# Patient Record
Sex: Female | Born: 1980 | Hispanic: Yes | Marital: Married | Smoking: Never smoker
Health system: Southern US, Community
[De-identification: ages and names within clinical notes are randomized; demographics above are authoritative.]

## PROBLEM LIST (undated history)

## (undated) DIAGNOSIS — A6000 Herpesviral infection of urogenital system, unspecified: Secondary | ICD-10-CM

## (undated) DIAGNOSIS — O139 Gestational [pregnancy-induced] hypertension without significant proteinuria, unspecified trimester: Secondary | ICD-10-CM

## (undated) DIAGNOSIS — O24419 Gestational diabetes mellitus in pregnancy, unspecified control: Secondary | ICD-10-CM

## (undated) DIAGNOSIS — N390 Urinary tract infection, site not specified: Secondary | ICD-10-CM

## (undated) DIAGNOSIS — O352XX0 Maternal care for (suspected) hereditary disease in fetus, not applicable or unspecified: Secondary | ICD-10-CM

## (undated) HISTORY — DX: Urinary tract infection, site not specified: N39.0

## (undated) HISTORY — DX: Herpesviral infection of urogenital system, unspecified: A60.00

## (undated) HISTORY — DX: Gestational diabetes mellitus in pregnancy, unspecified control: O24.419

## (undated) HISTORY — DX: Gestational (pregnancy-induced) hypertension without significant proteinuria, unspecified trimester: O13.9

## (undated) HISTORY — PX: NO PAST SURGERIES: SHX2092

## (undated) HISTORY — DX: Maternal care for (suspected) hereditary disease in fetus, not applicable or unspecified: O35.2XX0

---
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
## 2009-08-29 ENCOUNTER — Other Ambulatory Visit: Payer: Self-pay

## 2014-07-05 LAB — CYTOLOGY - PAP: Pap: NEGATIVE

## 2016-01-26 NOTE — L&D Delivery Note (Signed)
Delivery Note At 10:36 AM a viable female was delivered via Vaginal, Spontaneous Delivery (Presentation: OA; no difficulty with shoulders).  APGAR: 9, 9; weight pending.   Placenta status: spontaneous and grossly intact.  Cord: three vessel  with the following complications: none Anesthesia: epidural Episiotomy: None Lacerations: bilateral 1st degree labial tears, very superficial posterior 1st degree labial tear.  Hemostatic and will approximate without suturing Est. Blood Loss (mL): 100  Mom to postpartum.  Baby to Couplet care / Skin to Skin.  Kathrene Alu 09/14/2016, 10:56 AM  I confirm that I have verified the information documented in the resident's note and that I have also personally reperformed the physical exam and all medical decision making activities.    I was gloved and present for entire delivery SVD without incident No difficulty with shoulders  Lacerations as listed above These both approximate well and are not bleeding, so decision made not to repair.  Seabron Spates, CNM

## 2016-02-23 ENCOUNTER — Other Ambulatory Visit (HOSPITAL_COMMUNITY): Payer: Self-pay | Admitting: Nurse Practitioner

## 2016-02-23 DIAGNOSIS — Z369 Encounter for antenatal screening, unspecified: Secondary | ICD-10-CM

## 2016-02-23 LAB — HEMOGLOBIN EVAL RFX ELECTROPHORESIS: Hemoglobin Evaluation: NORMAL

## 2016-02-23 LAB — OB RESULTS CONSOLE VARICELLA ZOSTER ANTIBODY, IGG: Varicella: IMMUNE

## 2016-02-23 LAB — OB RESULTS CONSOLE ABO/RH: RH Type: POSITIVE

## 2016-02-23 LAB — OB RESULTS CONSOLE RPR: RPR: NONREACTIVE

## 2016-02-23 LAB — OB RESULTS CONSOLE HGB/HCT, BLOOD
HEMATOCRIT: 41 %
HEMOGLOBIN: 12.4 g/dL

## 2016-02-23 LAB — OB RESULTS CONSOLE GC/CHLAMYDIA
Chlamydia: NEGATIVE
Gonorrhea: NEGATIVE

## 2016-02-23 LAB — OB RESULTS CONSOLE PLATELET COUNT: PLATELETS: 245 10*3/uL

## 2016-02-23 LAB — OB RESULTS CONSOLE HEPATITIS B SURFACE ANTIGEN: Hepatitis B Surface Ag: NEGATIVE

## 2016-02-23 LAB — URINE CULTURE
Glucose 1 Hr Prenatal, POC: 197 mg/dL
URINE CULTURE, OB: NEGATIVE

## 2016-02-23 LAB — OB RESULTS CONSOLE ANTIBODY SCREEN: ANTIBODY SCREEN: NEGATIVE

## 2016-02-23 LAB — OB RESULTS CONSOLE HIV ANTIBODY (ROUTINE TESTING): HIV: NONREACTIVE

## 2016-02-27 ENCOUNTER — Encounter: Payer: Self-pay | Admitting: *Deleted

## 2016-03-01 ENCOUNTER — Ambulatory Visit: Payer: Self-pay | Admitting: *Deleted

## 2016-03-01 ENCOUNTER — Other Ambulatory Visit: Payer: Self-pay

## 2016-03-01 ENCOUNTER — Encounter: Payer: Medicaid Other | Attending: Obstetrics & Gynecology | Admitting: *Deleted

## 2016-03-01 DIAGNOSIS — Z713 Dietary counseling and surveillance: Secondary | ICD-10-CM | POA: Diagnosis present

## 2016-03-01 DIAGNOSIS — O24410 Gestational diabetes mellitus in pregnancy, diet controlled: Secondary | ICD-10-CM

## 2016-03-01 DIAGNOSIS — O24419 Gestational diabetes mellitus in pregnancy, unspecified control: Secondary | ICD-10-CM | POA: Diagnosis not present

## 2016-03-01 MED ORDER — GLUCOSE BLOOD VI STRP: ORAL_STRIP | 12 refills | Status: DC

## 2016-03-01 MED ORDER — ACCU-CHEK FASTCLIX LANCETS MISC: 1.0000 | Freq: Four times a day (QID) | 12 refills | Status: DC

## 2016-03-01 MED ORDER — ACCU-CHEK GUIDE W/DEVICE KIT: 1.0000 | PACK | Freq: Once | 0 refills | Status: AC

## 2016-03-01 NOTE — Progress Notes (Signed)
  Patient was seen on 03/01/2016 for Gestational Diabetes self-management . She is at 11 weeks today. She [facility] and was told she did not pass, so she is receiving education on GDM. The following learning objectives were met by the patient :   States the definition of Gestational Diabetes  States why dietary management is important in controlling blood glucose, she states this was covered by her visit with the [facility] RD  Describes the effects of carbohydrates on blood glucose levels  Demonstrates ability to create a balanced meal plan  Demonstrates carbohydrate counting   States when to check blood glucose levels  Demonstrates proper blood glucose monitoring techniques  States the effect of stress and exercise on blood glucose levels  States the importance of limiting caffeine and abstaining from alcohol and smoking  Plan:  Aim for 3 Carb Choices per meal (45 grams) +/- 1 either way  Aim for 1-2 Carbs per snack Begin reading food labels for Total Carbohydrate of foods Consider  increasing your activity level by walking or other activity daily as tolerated Begin checking BG before breakfast and 2 hours after first bite of breakfast, lunch and dinner as directed by MD  Take medication if directed by MD  Blood glucose monitor Rx called into pharmacy @ [facility] Patient instructed to test pre breakfast and 2 hours each meal as directed by MD Bring Log Book to every medical appointment   Patient instructed to monitor glucose levels: FBS: 60 - <90 2 hour: <120  Patient received the following handouts:  BG Log Sheets in case Log Book does not come with meter  Patient will be seen for follow-up as needed.

## 2016-03-03 DIAGNOSIS — O0990 Supervision of high risk pregnancy, unspecified, unspecified trimester: Secondary | ICD-10-CM | POA: Insufficient documentation

## 2016-03-03 NOTE — Progress Notes (Signed)
Tranferring care from health department. Declined flu vaccine at health department.

## 2016-03-08 ENCOUNTER — Encounter: Payer: Self-pay | Admitting: Obstetrics & Gynecology

## 2016-03-10 ENCOUNTER — Other Ambulatory Visit (HOSPITAL_COMMUNITY): Payer: Self-pay | Admitting: Nurse Practitioner

## 2016-03-10 ENCOUNTER — Ambulatory Visit (HOSPITAL_COMMUNITY): Payer: Medicaid Other

## 2016-03-10 ENCOUNTER — Other Ambulatory Visit (HOSPITAL_COMMUNITY): Payer: Self-pay | Admitting: *Deleted

## 2016-03-10 ENCOUNTER — Ambulatory Visit (HOSPITAL_COMMUNITY)
Admission: RE | Admit: 2016-03-10 | Discharge: 2016-03-10 | Disposition: A | Discharge status: Home / Self Care | Payer: Medicaid Other | Source: Ambulatory Visit | Attending: Nurse Practitioner | Admitting: Nurse Practitioner

## 2016-03-10 ENCOUNTER — Encounter (HOSPITAL_COMMUNITY): Payer: Self-pay

## 2016-03-10 ENCOUNTER — Other Ambulatory Visit: Payer: Self-pay

## 2016-03-10 ENCOUNTER — Encounter: Payer: Self-pay | Admitting: *Deleted

## 2016-03-10 DIAGNOSIS — Z369 Encounter for antenatal screening, unspecified: Secondary | ICD-10-CM

## 2016-03-10 DIAGNOSIS — Z3682 Encounter for antenatal screening for nuchal translucency: Secondary | ICD-10-CM

## 2016-03-10 DIAGNOSIS — Z3A12 12 weeks gestation of pregnancy: Secondary | ICD-10-CM

## 2016-03-10 DIAGNOSIS — O09299 Supervision of pregnancy with other poor reproductive or obstetric history, unspecified trimester: Secondary | ICD-10-CM

## 2016-03-10 DIAGNOSIS — O24311 Unspecified pre-existing diabetes mellitus in pregnancy, first trimester: Secondary | ICD-10-CM

## 2016-03-10 DIAGNOSIS — Z315 Encounter for genetic counseling: Secondary | ICD-10-CM | POA: Diagnosis present

## 2016-03-10 DIAGNOSIS — O24911 Unspecified diabetes mellitus in pregnancy, first trimester: Secondary | ICD-10-CM | POA: Diagnosis not present

## 2016-03-10 DIAGNOSIS — O352XX0 Maternal care for (suspected) hereditary disease in fetus, not applicable or unspecified: Secondary | ICD-10-CM | POA: Insufficient documentation

## 2016-03-10 DIAGNOSIS — O09291 Supervision of pregnancy with other poor reproductive or obstetric history, first trimester: Secondary | ICD-10-CM | POA: Insufficient documentation

## 2016-03-10 DIAGNOSIS — O24410 Gestational diabetes mellitus in pregnancy, diet controlled: Secondary | ICD-10-CM

## 2016-03-10 LAB — ROUTINE CHROMOSOME - KARYOTYPE

## 2016-03-10 NOTE — Progress Notes (Signed)
Appointment Date: 03/10/2016 DOB: 1980-08-26 Referring Provider: Nicholaus Bloom, NP Attending: Dr. Griffin Dakin  Ms. Jalexa Greninger and her partner, Gladstone Pih, were seen for genetic counseling because of a maternal age of 36 y.o. and a son with a chromosome deletion.  In summary:  Discussed AMA and associated risk for fetal aneuploidy  Will not be quite 36 at the time of delivery  Discussed options for screening  First screen  NIPS - Panorama drawn today  Discussed diagnostic testing options  Amniocentesis - declined  Reviewed family history concerns  Son with seizure disorder with chromosome microdeletion  Offered parental chromosome analysis with microarray  Patient's blood drawn today  Discussed carrier screening options - declined  CF  SMA  Hemoglobinopathies  We reviewed chromosomes, nondisjunction, and the associated 1 in 130 risk for fetal aneuploidy related to a maternal age of 36 y.o. at [redacted]w[redacted]d gestation.  We specifically discussed Down syndrome (trisomy 76), trisomies 38 and 20, and sex chromosome aneuploidies (47,XXX and 47,XXY) including the common features and prognoses of each.   We reviewed available screening options including First Screen, Quad screen, noninvasive prenatal screening (NIPS)/cell free DNA (cfDNA) screening, and detailed ultrasound. They were counseled that screening tests are used to modify a patient's a priori risk for aneuploidy, typically based on age. This estimate provides a pregnancy specific risk assessment. We reviewed the benefits and limitations of each option. Specifically, we discussed the conditions for which each test screens, the detection rates, and false positive rates of each. We discussed the possible results that the tests might provide including: positive, negative, unanticipated, and no result. Finally, they were counseled regarding the cost of each option and potential out of pocket expenses.   After consideration of all  the options, they elected to proceed with NIPS.  Those results will be available in 8-10 days.    A nuchal translucency ultrasound was performed today.  The report will be documented separately.    Ms. Leath was provided with written information regarding cystic fibrosis (CF), spinal muscular atrophy (SMA) and hemoglobinopathies including the carrier frequency, availability of carrier screening and prenatal diagnosis if indicated.  In addition, we discussed that CF and hemoglobinopathies are routinely screened for as part of the [facility] newborn screening panel.  After further discussion, she declined screening for CF, SMA and hemoglobinopathies.  Both family histories were reviewed.  Ms. Hoey reported that she has a 20 year old son Wende Neighbors dob 08/26/99) from another relationship who began to have seizures when he was 57 months old.  Her son underwent chromosome analysis with microarray at [facility] in 2011.  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  This deletion contains at least two genes including TTC21B and SCN1A.  Mutations of SCN1A have been associated with: autosomal dominant Dravet syndrome; autosomal dominant generalized epilepsy with febrile seizures, type 2; familial febrile seizures, 3A; and autosomal dominant familial hemiplegic migraine.  Ms. Sylla describes her son as having intellectual disability as well autism.  He has impaired mobility, few words, is incontinent, and requires help with daily living skills.  She gave consent for the genetic counselor to review her son's medical records at [facility].  Based upon her son's medical history, it seems possible that his symptoms are due to the presence of this chromosomal microdeletion.  We discussed that many microdeletions, such as the one identified in  her son, are de novo or new in the family, not inherited.  If this was to be a new genetic  change for her son, there would not be expected to be an increased chance for a similar deletion in another child.  It is unlikely that Ms. Rountree or her son's father has the same deletion as they do not have the same symptoms as those of their son.  In rare cases a familial balanced translocation can be responsible for a chromosome microdeletion.  For this reason, Ms. Quero was offered the option of peripheral blood chromosome analysis and microarray analysis.  She wanted to have this drawn today.  Those results will be provided to her when they are complete.  The remainder of the family histories were reviewed and found to be noncontributory for birth defects, intellectual disability, and known genetic conditions. Without further information regarding the provided family history, an accurate genetic risk cannot be calculated. Further genetic counseling is warranted if more information is obtained.  Ms. Weatherley denied exposure to environmental toxins or chemical agents. She denied the use of alcohol, tobacco or street drugs. She denied significant viral illnesses during the course of her pregnancy. Her medical and surgical histories were noncontributory.   I counseled this couple regarding the above risks and available options.  The approximate face-to-face time with the genetic counselor was 50 minutes.  Most of the counseling was provided by Arcola Jansky, [employer] genetic counseling student, under my direct supervision.   Cam Hai, MS,  Certified Genetic Counselor

## 2016-03-11 LAB — HEMOGLOBIN A1C
Hgb A1c MFr Bld: 5.7 % — ABNORMAL HIGH (ref 4.8–5.6)
Mean Plasma Glucose: 117 mg/dL

## 2016-03-15 ENCOUNTER — Encounter: Payer: Self-pay | Admitting: Obstetrics and Gynecology

## 2016-03-17 ENCOUNTER — Telehealth (HOSPITAL_COMMUNITY): Payer: Self-pay | Admitting: Genetics

## 2016-03-17 ENCOUNTER — Telehealth: Payer: Self-pay | Admitting: *Deleted

## 2016-03-17 DIAGNOSIS — O24410 Gestational diabetes mellitus in pregnancy, diet controlled: Secondary | ICD-10-CM

## 2016-03-17 MED ORDER — GLUCOSE BLOOD VI STRP: ORAL_STRIP | 12 refills | Status: DC

## 2016-03-17 NOTE — Telephone Encounter (Signed)
[place] to discuss her prenatal cell free DNA test results.  Mrs. Elea Krafft had Panorama screening through [facility] at [facility].  Screening was offered because of AMA.   The patient was identified by name and DOB.  We reviewed that these are within normal limits, showing a less than 1 in 10,000 risk for trisomies 21, 18 and 13, and monosomy X (Turner syndrome).  In addition, the risk for triploidy and sex chromosome trisomies (47,XXX and 47,XXY) was also low risk.  We reviewed that this testing identifies > 99% of pregnancies with trisomy 13, trisomy 35, sex chromosome trisomies (47,XXX and 47,XXY), and triploidy. The detection rate for trisomy 18 is 96%.  The detection rate for monosomy X is ~92%.  The false positive rate is <0.1% for all conditions. Testing was also consistent with female fetal sex.  The patient did wish to know fetal sex.    Maternal peripheral blood chromosomes and array are still pending.  We will call her again when those are avaialble.  She understands that this testing does not identify all genetic conditions.  All questions were answered to her satisfaction, she was encouraged to call with additional questions or concerns.  Cam Hai, MS Certified Genetic Counselor

## 2016-03-17 NOTE — Telephone Encounter (Signed)
Pt left message stating that she is running out of test strips. She states her pharmacy has contacted the doctor but no response. Rx updated and e-prescribed to pharmacy. Pt was notified and voiced understanding.

## 2016-03-18 ENCOUNTER — Other Ambulatory Visit: Payer: Self-pay

## 2016-03-22 ENCOUNTER — Other Ambulatory Visit: Payer: Self-pay

## 2016-03-23 ENCOUNTER — Encounter: Payer: Self-pay | Admitting: Obstetrics and Gynecology

## 2016-03-23 ENCOUNTER — Ambulatory Visit (INDEPENDENT_AMBULATORY_CARE_PROVIDER_SITE_OTHER): Payer: Medicaid Other | Admitting: Obstetrics and Gynecology

## 2016-03-23 VITALS — BP 104/64 | HR 75 | Wt 172.1 lb

## 2016-03-23 DIAGNOSIS — O24410 Gestational diabetes mellitus in pregnancy, diet controlled: Secondary | ICD-10-CM | POA: Diagnosis present

## 2016-03-23 DIAGNOSIS — O352XX0 Maternal care for (suspected) hereditary disease in fetus, not applicable or unspecified: Secondary | ICD-10-CM

## 2016-03-23 DIAGNOSIS — B009 Herpesviral infection, unspecified: Secondary | ICD-10-CM

## 2016-03-23 DIAGNOSIS — Z6831 Body mass index (BMI) 31.0-31.9, adult: Secondary | ICD-10-CM

## 2016-03-23 DIAGNOSIS — O99210 Obesity complicating pregnancy, unspecified trimester: Secondary | ICD-10-CM | POA: Insufficient documentation

## 2016-03-23 DIAGNOSIS — O24419 Gestational diabetes mellitus in pregnancy, unspecified control: Secondary | ICD-10-CM

## 2016-03-23 DIAGNOSIS — O99212 Obesity complicating pregnancy, second trimester: Secondary | ICD-10-CM | POA: Diagnosis not present

## 2016-03-23 DIAGNOSIS — O0992 Supervision of high risk pregnancy, unspecified, second trimester: Secondary | ICD-10-CM

## 2016-03-23 HISTORY — DX: Herpesviral infection, unspecified: B00.9

## 2016-03-23 MED ORDER — ASPIRIN EC 81 MG PO TBEC: 81.0000 mg | DELAYED_RELEASE_TABLET | Freq: Every day | ORAL | 6 refills | Status: DC

## 2016-03-23 MED ORDER — GLYBURIDE 2.5 MG PO TABS: 1.2500 mg | ORAL_TABLET | Freq: Every day | ORAL | 6 refills | Status: DC

## 2016-03-23 NOTE — Progress Notes (Signed)
New OB Note  03/23/2016   Clinic: [facility] [place]  Chief Complaint: NOB  Transfer of Care Patient: Yes, [facility]  History of Present Illness: Ms. Treichel is a 36 y.o. G2P1001 @ 14/1 weeks ([place] 8/27, based on Patient's last menstrual period was 12/15/2015.=12wk u/s).  Preg complicated by has Gestational diabetes mellitus; Supervision of high-risk pregnancy; Hereditary disease in family possibly affecting fetus, affecting management of mother, antepartum condition or complication, not applicable or unspecified fetus; History of HSV-1 infection; BMI 31.0-31.9,adult; and Obesity in pregnancy on her problem list.   No s/s of PTL  ROS: A 12-point review of systems was performed and negative, except as stated in the above HPI.  OBGYN History: As per HPI. OB History  Gravida Para Term Preterm AB Living  2 1 1     1   SAB TAB Ectopic Multiple Live Births               # Outcome Date GA Lbr Len/2nd Weight Sex Delivery Anes PTL Lv  2 Current           1 Term 08/26/02             Birth Comments: Baby diagnosed with chromosomal deletion r/t autism, epilepsy    Obstetric Comments  8lbs 11oz.     Any issues with any prior pregnancies: no Prior children are healthy, doing well, and without any problems or issues: no, son with chromosomal deletion, MR and seizures.  Pap history: 2015 NILM but no EC/TZ   Past Medical History: Past Medical History:  Diagnosis Date  . UTI (urinary tract infection)    2015    Past Surgical History: Past Surgical History:  Procedure Laterality Date  . NO PAST SURGERIES      Family History:  Family History  Problem Relation Age of Onset  . Diabetes Mother   . Diabetes Father     Social History:  Social History   Social History  . Marital status: Divorced    Spouse name: N/A  . Number of children: N/A  . Years of education: N/A   Occupational History  . Not on file.   Social History Main Topics  . Smoking status: Never Smoker  .  Smokeless tobacco: Never Used  . Alcohol use No  . Drug use: No  . Sexual activity: Yes   Other Topics Concern  . Not on file   Social History Narrative  . No narrative on file    Allergy: No Known Allergies  Health Maintenance:  Mammogram Up to Date: not applicable  Current Outpatient Medications: PNV  Physical Exam:   BP 104/64   Pulse 75   Wt 172 lb 1.6 oz (78.1 kg)   LMP 12/15/2015   BMI 30.49 kg/m  Body mass index is 30.49 kg/m. Fundal height: not applicable FHTs: 99991111  General appearance: Well nourished, well developed female in no acute distress.   Laboratory: [facility] labs 1/29 Negative GC/CT/UCx/AB pos/CBC/RPR/HIV/UCx/Vi  Imaging:  NT scan normal  Assessment: pt doing well  Plan: 1. Diet controlled gestational diabetes mellitus (GDM) in first trimester Seen by DM already. Log book shows elevated fastings in the low 100s but normal 2hr PPs. Patient watching her diet closely already. Recommend starting low dose glyburide qhs which she is amenable to. (1.25mg ). a1c 5.7 earlier this month. Will get baseline pre-x labs. Pt also amenable to starting baby asa. Recommend fetal echo at 22-23wks.  - Protein / creatinine ratio, urine - Comprehensive metabolic  panel  2. Supervision of high risk pregnancy in second trimester Routine care. Offer afp nv. Follow up pending HD labs.   3. Obesity in pregnancy No change in plan of care  4. History of HSV-1 infection Offer valtrex ppx later in pregnancy  5. Hereditary disease in family possibly affecting fetus, affecting management of mother, antepartum condition or complication, not applicable or unspecified fetus Seen by [facility] already and negative microarray and panorama. F/u anatomy scan  6. BMI 31.0-31.9,adult See above.   Problem list reviewed and updated.  Follow up in 2 weeks.  >50% of 20 min visit spent on counseling and coordination of care.     Durene Romans MD Attending [facility] for [facility])

## 2016-03-24 ENCOUNTER — Telehealth (HOSPITAL_COMMUNITY): Payer: Self-pay | Admitting: Genetics

## 2016-03-24 LAB — COMPREHENSIVE METABOLIC PANEL
ALBUMIN: 3.9 g/dL (ref 3.5–5.5)
ALT: 11 IU/L (ref 0–32)
AST: 11 IU/L (ref 0–40)
Albumin/Globulin Ratio: 1.3 (ref 1.2–2.2)
Alkaline Phosphatase: 48 IU/L (ref 39–117)
BUN / CREAT RATIO: 26 — AB (ref 9–23)
BUN: 10 mg/dL (ref 6–20)
Bilirubin Total: 0.2 mg/dL (ref 0.0–1.2)
CALCIUM: 9.5 mg/dL (ref 8.7–10.2)
CO2: 23 mmol/L (ref 18–29)
Chloride: 101 mmol/L (ref 96–106)
Creatinine, Ser: 0.38 mg/dL — ABNORMAL LOW (ref 0.57–1.00)
GFR calc non Af Amer: 139 mL/min/{1.73_m2} (ref >59)
GFR, EST AFRICAN AMERICAN: 160 mL/min/{1.73_m2} (ref >59)
GLUCOSE: 95 mg/dL (ref 65–99)
Globulin, Total: 3 g/dL (ref 1.5–4.5)
Potassium: 4 mmol/L (ref 3.5–5.2)
Sodium: 139 mmol/L (ref 134–144)
TOTAL PROTEIN: 6.9 g/dL (ref 6.0–8.5)

## 2016-03-24 LAB — PROTEIN / CREATININE RATIO, URINE
Creatinine, Urine: 65.3 mg/dL
Protein, Ur: 5.4 mg/dL
Protein/Creat Ratio: 83 mg/g creat (ref 0–200)

## 2016-03-24 NOTE — Telephone Encounter (Signed)
Called Ms. Catherine Houston to review her normal microarray results.  Discussed that this means that the deletion that her older son has either came from his father or, more likely, as a spontaneous change for her son.  With her chromosomes being normal , she would not have an increased likelihood for having offspring with microdeletions, and as she does not have the same deletion, she would not be at increased risk to pass the deletion on.  As the father of her son has not been tested, we can not determine if her son's microdeletion came from him or not; however as the father of her current pregnancy is different, it would not pose an increased risk to her current pregnancy.  She asked about the amniocentesis.  We discussed that an amniocentesis would identify microdeletions and duplications, but based on the information currently available, we did not have reason to believe that there was an increased chance for a deletion or duplication in her current pregnancy.  Discussed that the risk of complications from amniocentesis was likely greater than the chance that there would be a change identified.  However, if she felt more reassured by having an amniocentesis, that is certainly an option.  She declined amniocentesis at this time.  All questions were answered to her satisfaction and she has my contact information should additional questions or concerns arise.

## 2016-04-06 ENCOUNTER — Ambulatory Visit (INDEPENDENT_AMBULATORY_CARE_PROVIDER_SITE_OTHER): Payer: Medicaid Other | Admitting: Obstetrics and Gynecology

## 2016-04-06 VITALS — BP 116/63 | HR 79 | Wt 175.2 lb

## 2016-04-06 DIAGNOSIS — O352XX0 Maternal care for (suspected) hereditary disease in fetus, not applicable or unspecified: Secondary | ICD-10-CM

## 2016-04-06 DIAGNOSIS — O0992 Supervision of high risk pregnancy, unspecified, second trimester: Secondary | ICD-10-CM

## 2016-04-06 DIAGNOSIS — O24419 Gestational diabetes mellitus in pregnancy, unspecified control: Secondary | ICD-10-CM | POA: Diagnosis present

## 2016-04-06 DIAGNOSIS — B009 Herpesviral infection, unspecified: Secondary | ICD-10-CM

## 2016-04-06 LAB — POCT URINALYSIS DIP (DEVICE)
Bilirubin Urine: NEGATIVE
Glucose, UA: NEGATIVE mg/dL
Ketones, ur: 15 mg/dL — AB
Leukocytes, UA: NEGATIVE
NITRITE: NEGATIVE
PH: 7 (ref 5.0–8.0)
Protein, ur: NEGATIVE mg/dL
Specific Gravity, Urine: 1.02 (ref 1.005–1.030)
UROBILINOGEN UA: 0.2 mg/dL (ref 0.0–1.0)

## 2016-04-06 NOTE — Progress Notes (Signed)
Subjective:  Catherine Houston is a 36 y.o. G2P1001 at [redacted]w[redacted]d being seen today for ongoing prenatal care.  She is currently monitored for the following issues for this high-risk pregnancy and has GDM, class A2; Supervision of high-risk pregnancy; Hereditary disease in family possibly affecting fetus, affecting management of mother, antepartum condition or complication, not applicable or unspecified fetus; History of HSV-1 infection; BMI 31.0-31.9,adult; and Obesity in pregnancy on her problem list.  Patient reports no complaints.   . Vag. Bleeding: None.  Movement: Present. Denies leaking of fluid.   The following portions of the patient's history were reviewed and updated as appropriate: allergies, current medications, past family history, past medical history, past social history, past surgical history and problem list. Problem list updated.  Objective:   Vitals:   04/06/16 1247  BP: 116/63  Pulse: 79  Weight: 175 lb 3.2 oz (79.5 kg)    Fetal Status: Fetal Heart Rate (bpm): 150   Movement: Present     General:  Alert, oriented and cooperative. Patient is in no acute distress.  Skin: Skin is warm and dry. No rash noted.   Cardiovascular: Normal heart rate noted  Respiratory: Normal respiratory effort, no problems with respiration noted  Abdomen: Soft, gravid, appropriate for gestational age. Pain/Pressure: Absent     Pelvic:  Cervical exam deferred        Extremities: Normal range of motion.  Edema: None  Mental Status: Normal mood and affect. Normal behavior. Normal judgment and thought content.   Urinalysis:      Assessment and Plan:  Pregnancy: G2P1001 at [redacted]w[redacted]d  1. GDM, class A2 BS are in goal range for the most part. Outliers appear to be diet related. Reviewed diet with pt. Will continue with Glyburide at present dose. Pt instructed to call nurse with BS report in 2 weeks - US Fetal Echocardiography; Future  2. Supervision of high risk pregnancy in second trimester Continue with  BASA and PNV  3. Hereditary disease in family possibly affecting fetus, affecting management of mother, antepartum condition or complication, not applicable or unspecified fetus Normal genetic screening  AFP today  4. History of HSV-1 infection Prophylaxis starting at 36 weeks  Preterm labor symptoms and general obstetric precautions including but not limited to vaginal bleeding, contractions, leaking of fluid and fetal movement were reviewed in detail with the patient. Please refer to After Visit Summary for other counseling recommendations.  Return in about 4 weeks (around 05/04/2016) for OB visit.   Chancy Milroy, MD

## 2016-04-06 NOTE — Patient Instructions (Signed)

## 2016-04-16 LAB — AFP, SERUM, OPEN SPINA BIFIDA
AFP MoM: 0.64
AFP Value: 19.4 ng/mL
Gest. Age on Collection Date: 16.1 weeks
Maternal Age At EDD: 34.9 years
OSBR RISK 1 IN: 10000
Test Results:: NEGATIVE
WEIGHT: 175 [lb_av]

## 2016-04-27 ENCOUNTER — Ambulatory Visit (HOSPITAL_COMMUNITY)
Admission: RE | Admit: 2016-04-27 | Discharge: 2016-04-27 | Disposition: A | Discharge status: Home / Self Care | Payer: Medicaid Other | Source: Ambulatory Visit | Attending: Nurse Practitioner | Admitting: Nurse Practitioner

## 2016-05-04 ENCOUNTER — Ambulatory Visit (INDEPENDENT_AMBULATORY_CARE_PROVIDER_SITE_OTHER): Payer: Medicaid Other | Admitting: Obstetrics and Gynecology

## 2016-05-04 VITALS — BP 104/66 | HR 75 | Wt 176.7 lb

## 2016-05-04 DIAGNOSIS — O24419 Gestational diabetes mellitus in pregnancy, unspecified control: Secondary | ICD-10-CM | POA: Diagnosis present

## 2016-05-04 DIAGNOSIS — O99212 Obesity complicating pregnancy, second trimester: Secondary | ICD-10-CM

## 2016-05-04 DIAGNOSIS — O0992 Supervision of high risk pregnancy, unspecified, second trimester: Secondary | ICD-10-CM | POA: Diagnosis not present

## 2016-05-04 DIAGNOSIS — O99210 Obesity complicating pregnancy, unspecified trimester: Secondary | ICD-10-CM

## 2016-05-04 MED ORDER — GLYBURIDE 2.5 MG PO TABS: 1.2500 mg | ORAL_TABLET | Freq: Two times a day (BID) | ORAL | 6 refills | Status: DC

## 2016-05-04 NOTE — Progress Notes (Signed)
Prenatal Visit Note Date: 05/04/2016 [facility]: [facility]  Subjective:  Catherine Houston is a 36 y.o. G2P1001 at [redacted]w[redacted]d being seen today for ongoing prenatal care.  She is currently monitored for the following issues for this high-risk pregnancy and has GDM, class A2; Supervision of high-risk pregnancy; Hereditary disease in family possibly affecting fetus, affecting management of mother, antepartum condition or complication, not applicable or unspecified fetus; History of HSV-1 infection; BMI 31.0-31.9,adult; and Obesity in pregnancy on her problem list.  Patient reports no complaints.   Contractions: Not present. Vag. Bleeding: None.  Movement: Present. Denies leaking of fluid.   The following portions of the patient's history were reviewed and updated as appropriate: allergies, current medications, past family history, past medical history, past social history, past surgical history and problem list. Problem list updated.  Objective:   Vitals:   05/04/16 1529  BP: 104/66  Pulse: 75  Weight: 176 lb 11.2 oz (80.2 kg)    Fetal Status: Fetal Heart Rate (bpm): 152   Movement: Present     General:  Alert, oriented and cooperative. Patient is in no acute distress.  Skin: Skin is warm and dry. No rash noted.   Cardiovascular: Normal heart rate noted  Respiratory: Normal respiratory effort, no problems with respiration noted  Abdomen: Soft, gravid, appropriate for gestational age. Pain/Pressure: Absent     Pelvic:  Cervical exam deferred        Extremities: Normal range of motion.  Edema: None  Mental Status: Normal mood and affect. Normal behavior. Normal judgment and thought content.   Urinalysis:      Assessment and Plan:  Pregnancy: G2P1001 at [redacted]w[redacted]d  1. GDM, class A2 Patient currently on glyburide 1.25 qhs. AM fastings in the low to mid 90s. 2hr PP breakfast normal. 2hr PP lunch and dinner in the 120s-140s close to about 25% of the time. Recommend doing glyburide 1.25  with breakfast and with dinner. Pt to come in on 4/19 for RN only visit before her anatomy and growth scan to review log book; pt told pretty good chance will have to increase dose then. Fetal echo normal yesterday  2. Obesity in pregnancy See above  3. Supervision of high risk pregnancy in second trimester No change in plan of care  Preterm labor symptoms and general obstetric precautions including but not limited to vaginal bleeding, contractions, leaking of fluid and fetal movement were reviewed in detail with the patient. Please refer to After Visit Summary for other counseling recommendations.  Return in about 9 days (around 05/13/2016) for RN only visit at 245 or 3pm for BS log check. 3-4wk ROB.   Aletha Halim, MD

## 2016-05-04 NOTE — Progress Notes (Signed)
Refused flu vaccine.

## 2016-05-11 ENCOUNTER — Ambulatory Visit (HOSPITAL_COMMUNITY): Payer: Medicaid Other

## 2016-05-13 ENCOUNTER — Encounter (HOSPITAL_COMMUNITY): Payer: Self-pay

## 2016-05-13 ENCOUNTER — Ambulatory Visit: Payer: Medicaid Other

## 2016-05-13 ENCOUNTER — Ambulatory Visit (HOSPITAL_COMMUNITY)
Admission: RE | Admit: 2016-05-13 | Discharge: 2016-05-13 | Disposition: A | Discharge status: Home / Self Care | Payer: Medicaid Other | Source: Ambulatory Visit | Attending: Nurse Practitioner | Admitting: Nurse Practitioner

## 2016-05-13 ENCOUNTER — Other Ambulatory Visit (HOSPITAL_COMMUNITY): Payer: Self-pay | Admitting: Obstetrics and Gynecology

## 2016-05-13 DIAGNOSIS — O24410 Gestational diabetes mellitus in pregnancy, diet controlled: Secondary | ICD-10-CM

## 2016-05-13 DIAGNOSIS — O24415 Gestational diabetes mellitus in pregnancy, controlled by oral hypoglycemic drugs: Secondary | ICD-10-CM | POA: Insufficient documentation

## 2016-05-13 DIAGNOSIS — O0992 Supervision of high risk pregnancy, unspecified, second trimester: Secondary | ICD-10-CM

## 2016-05-13 DIAGNOSIS — O99212 Obesity complicating pregnancy, second trimester: Secondary | ICD-10-CM | POA: Insufficient documentation

## 2016-05-13 DIAGNOSIS — Z3A21 21 weeks gestation of pregnancy: Secondary | ICD-10-CM | POA: Diagnosis not present

## 2016-05-13 DIAGNOSIS — Z363 Encounter for antenatal screening for malformations: Secondary | ICD-10-CM

## 2016-05-13 DIAGNOSIS — O24419 Gestational diabetes mellitus in pregnancy, unspecified control: Secondary | ICD-10-CM

## 2016-05-13 MED ORDER — GLYBURIDE 2.5 MG PO TABS: 2.5000 mg | ORAL_TABLET | Freq: Every day | ORAL | 3 refills | Status: DC

## 2016-05-13 NOTE — Progress Notes (Signed)
Pt here today for BS log to be evaluated.  Notified Dr. Roselie Awkward of BS and pt is currently taking Glyburide 2.5mg   1/2 am and 1/2 pm.  Received new order per provider to increase pm dose to full tablet.  E-prescribed Glyburide 2.5 mg tablet so pt can have enough of the dose and to also have insurance to cover.  Pt stated understanding with no further questions.

## 2016-05-14 ENCOUNTER — Other Ambulatory Visit (HOSPITAL_COMMUNITY): Payer: Self-pay | Admitting: *Deleted

## 2016-05-14 DIAGNOSIS — O24415 Gestational diabetes mellitus in pregnancy, controlled by oral hypoglycemic drugs: Secondary | ICD-10-CM

## 2016-06-03 ENCOUNTER — Ambulatory Visit (INDEPENDENT_AMBULATORY_CARE_PROVIDER_SITE_OTHER): Payer: Medicaid Other | Admitting: Obstetrics & Gynecology

## 2016-06-03 VITALS — BP 108/68 | HR 74 | Wt 180.1 lb

## 2016-06-03 DIAGNOSIS — O0993 Supervision of high risk pregnancy, unspecified, third trimester: Secondary | ICD-10-CM

## 2016-06-03 DIAGNOSIS — O24419 Gestational diabetes mellitus in pregnancy, unspecified control: Secondary | ICD-10-CM

## 2016-06-03 MED ORDER — GLYBURIDE 2.5 MG PO TABS: ORAL_TABLET | ORAL | 3 refills | Status: DC

## 2016-06-03 NOTE — Progress Notes (Signed)
   PRENATAL VISIT NOTE  Subjective:  Catherine Houston is a 36 y.o. G2P1001 at [redacted]w[redacted]d being seen today for ongoing prenatal care.  She is currently monitored for the following issues for this high-risk pregnancy and has GDM, class A2; Supervision of high-risk pregnancy; Hereditary disease in family possibly affecting fetus, affecting management of mother, antepartum condition or complication, not applicable or unspecified fetus; History of HSV-1 infection; BMI 31.0-31.9,adult; and Obesity in pregnancy on her problem list.  Patient reports no complaints.  Contractions: Not present. Vag. Bleeding: None.  Movement: Present. Denies leaking of fluid.   The following portions of the patient's history were reviewed and updated as appropriate: allergies, current medications, past family history, past medical history, past social history, past surgical history and problem list. Problem list updated.  Objective:   Vitals:   06/03/16 1508  BP: 108/68  Pulse: 74  Weight: 180 lb 1.6 oz (81.7 kg)    Fetal Status: Fetal Heart Rate (bpm): 157 Fundal Height: 25 cm Movement: Present     General:  Alert, oriented and cooperative. Patient is in no acute distress.  Skin: Skin is warm and dry. No rash noted.   Cardiovascular: Normal heart rate noted  Respiratory: Normal respiratory effort, no problems with respiration noted  Abdomen: Soft, gravid, appropriate for gestational age. Pain/Pressure: Present     Pelvic:  Cervical exam deferred        Extremities: Normal range of motion.  Edema: Trace  Mental Status: Normal mood and affect. Normal behavior. Normal judgment and thought content.   Assessment and Plan:  Pregnancy: G2P1001 at [redacted]w[redacted]d  1. GDM, class A2 Blood sugars within range. Continue Glyburide. Next scan is on 06/24/16. - glyBURIDE (DIABETA) 2.5 MG tablet; Take 1/2 a tablet in the morning and 1 tablet at bedtime  Dispense: 60 tablet; Refill: 3  2. Supervision of high risk pregnancy in third  trimester Preterm labor symptoms and general obstetric precautions including but not limited to vaginal bleeding, contractions, leaking of fluid and fetal movement were reviewed in detail with the patient. Please refer to After Visit Summary for other counseling recommendations.  Return in about 4 weeks (around 07/01/2016) for 3rd trimester labs, TDap, OB Visit.   Osborne Oman, MD

## 2016-06-03 NOTE — Patient Instructions (Signed)
Third Trimester of Pregnancy The third trimester is from week 28 through week 40 (months 7 through 9). The third trimester is a time when the unborn baby (fetus) is growing rapidly. At the end of the ninth month, the fetus is about 20 inches in length and weighs 6-10 pounds. Body changes during your third trimester Your body will continue to go through many changes during pregnancy. The changes vary from woman to woman. During the third trimester:  Your weight will continue to increase. You can expect to gain 25-35 pounds (11-16 kg) by the end of the pregnancy.  You may begin to get stretch marks on your hips, abdomen, and breasts.  You may urinate more often because the fetus is moving lower into your pelvis and pressing on your bladder.  You may develop or continue to have heartburn. This is caused by increased hormones that slow down muscles in the digestive tract.  You may develop or continue to have constipation because increased hormones slow digestion and cause the muscles that push waste through your intestines to relax.  You may develop hemorrhoids. These are swollen veins (varicose veins) in the rectum that can itch or be painful.  You may develop swollen, bulging veins (varicose veins) in your legs.  You may have increased body aches in the pelvis, back, or thighs. This is due to weight gain and increased hormones that are relaxing your joints.  You may have changes in your hair. These can include thickening of your hair, rapid growth, and changes in texture. Some women also have hair loss during or after pregnancy, or hair that feels dry or thin. Your hair will most likely return to normal after your baby is born.  Your breasts will continue to grow and they will continue to become tender. A yellow fluid (colostrum) may leak from your breasts. This is the first milk you are producing for your baby.  Your belly button may stick out.  You may notice more swelling in your  hands, face, or ankles.  You may have increased tingling or numbness in your hands, arms, and legs. The skin on your belly may also feel numb.  You may feel short of breath because of your expanding uterus.  You may have more problems sleeping. This can be caused by the size of your belly, increased need to urinate, and an increase in your body's metabolism.  You may notice the fetus "dropping," or moving lower in your abdomen (lightening).  You may have increased vaginal discharge.  You may notice your joints feel loose and you may have pain around your pelvic bone. What to expect at prenatal visits You will have prenatal exams every 2 weeks until week 36. Then you will have weekly prenatal exams. During a routine prenatal visit:  You will be weighed to make sure you and the baby are growing normally.  Your blood pressure will be taken.  Your abdomen will be measured to track your baby's growth.  The fetal heartbeat will be listened to.  Any test results from the previous visit will be discussed.  You may have a cervical check near your due date to see if your cervix has softened or thinned (effaced).  You will be tested for Group B streptococcus. This happens between 35 and 37 weeks. Your health care provider may ask you:  What your birth plan is.  How you are feeling.  If you are feeling the baby move.  If you have had any  abnormal symptoms, such as leaking fluid, bleeding, severe headaches, or abdominal cramping.  If you are using any tobacco products, including cigarettes, chewing tobacco, and electronic cigarettes.  If you have any questions. Other tests or screenings that may be performed during your third trimester include:  Blood tests that check for low iron levels (anemia).  Fetal testing to check the health, activity level, and growth of the fetus. Testing is done if you have certain medical conditions or if there are problems during the  pregnancy.  Nonstress test (NST). This test checks the health of your baby to make sure there are no signs of problems, such as the baby not getting enough oxygen. During this test, a belt is placed around your belly. The baby is made to move, and its heart rate is monitored during movement. What is false labor? False labor is a condition in which you feel small, irregular tightenings of the muscles in the womb (contractions) that usually go away with rest, changing position, or drinking water. These are called Braxton Hicks contractions. Contractions may last for hours, days, or even weeks before true labor sets in. If contractions come at regular intervals, become more frequent, increase in intensity, or become painful, you should see your health care provider. What are the signs of labor?  Abdominal cramps.  Regular contractions that start at 10 minutes apart and become stronger and more frequent with time.  Contractions that start on the top of the uterus and spread down to the lower abdomen and back.  Increased pelvic pressure and dull back pain.  A watery or bloody mucus discharge that comes from the vagina.  Leaking of amniotic fluid. This is also known as your "water breaking." It could be a slow trickle or a gush. Let your health care provider know if it has a color or strange odor. If you have any of these signs, call your health care provider right away, even if it is before your due date. Follow these instructions at home: Medicines   Follow your health care provider's instructions regarding medicine use. Specific medicines may be either safe or unsafe to take during pregnancy.  Take a prenatal vitamin that contains at least 600 micrograms (mcg) of folic acid.  If you develop constipation, try taking a stool softener if your health care provider approves. Eating and drinking   Eat a balanced diet that includes fresh fruits and vegetables, whole grains, good sources of protein  such as meat, eggs, or tofu, and low-fat dairy. Your health care provider will help you determine the amount of weight gain that is right for you.  Avoid raw meat and uncooked cheese. These carry germs that can cause birth defects in the baby.  If you have low calcium intake from food, talk to your health care provider about whether you should take a daily calcium supplement.  Eat four or five small meals rather than three large meals a day.  Limit foods that are high in fat and processed sugars, such as fried and sweet foods.  To prevent constipation:  Drink enough fluid to keep your urine clear or pale yellow.  Eat foods that are high in fiber, such as fresh fruits and vegetables, whole grains, and beans. Activity   Exercise only as directed by your health care provider. Most women can continue their usual exercise routine during pregnancy. Try to exercise for 30 minutes at least 5 days a week. Stop exercising if you experience uterine contractions.  Avoid heavy  lifting.  Do not exercise in extreme heat or humidity, or at high altitudes.  Wear low-heel, comfortable shoes.  Practice good posture.  You may continue to have sex unless your health care provider tells you otherwise. Relieving pain and discomfort   Take frequent breaks and rest with your legs elevated if you have leg cramps or low back pain.  Take warm sitz baths to soothe any pain or discomfort caused by hemorrhoids. Use hemorrhoid cream if your health care provider approves.  Wear a good support bra to prevent discomfort from breast tenderness.  If you develop varicose veins:  Wear support pantyhose or compression stockings as told by your healthcare provider.  Elevate your feet for 15 minutes, 3-4 times a day. Prenatal care   Write down your questions. Take them to your prenatal visits.  Keep all your prenatal visits as told by your health care provider. This is important. Safety   Wear your seat belt  at all times when driving.  Make a list of emergency phone numbers, including numbers for family, friends, the hospital, and police and fire departments. General instructions   Avoid cat litter boxes and soil used by cats. These carry germs that can cause birth defects in the baby. If you have a cat, ask someone to clean the litter box for you.  Do not travel far distances unless it is absolutely necessary and only with the approval of your health care provider.  Do not use hot tubs, steam rooms, or saunas.  Do not drink alcohol.  Do not use any products that contain nicotine or tobacco, such as cigarettes and e-cigarettes. If you need help quitting, ask your health care provider.  Do not use any medicinal herbs or unprescribed drugs. These chemicals affect the formation and growth of the baby.  Do not douche or use tampons or scented sanitary pads.  Do not cross your legs for long periods of time.  To prepare for the arrival of your baby:  Take prenatal classes to understand, practice, and ask questions about labor and delivery.  Make a trial run to the hospital.  Visit the hospital and tour the maternity area.  Arrange for maternity or paternity leave through employers.  Arrange for family and friends to take care of pets while you are in the hospital.  Purchase a rear-facing car seat and make sure you know how to install it in your car.  Pack your hospital bag.  Prepare the baby's nursery. Make sure to remove all pillows and stuffed animals from the baby's crib to prevent suffocation.  Visit your dentist if you have not gone during your pregnancy. Use a soft toothbrush to brush your teeth and be gentle when you floss. Contact a health care provider if:  You are unsure if you are in labor or if your water has broken.  You become dizzy.  You have mild pelvic cramps, pelvic pressure, or nagging pain in your abdominal area.  You have lower back pain.  You have  persistent nausea, vomiting, or diarrhea.  You have an unusual or bad smelling vaginal discharge.  You have pain when you urinate. Get help right away if:  Your water breaks before 37 weeks.  You have regular contractions less than 5 minutes apart before 37 weeks.  You have a fever.  You are leaking fluid from your vagina.  You have spotting or bleeding from your vagina.  You have severe abdominal pain or cramping.  You have rapid weight  loss or weight gain.  You have shortness of breath with chest pain.  You notice sudden or extreme swelling of your face, hands, ankles, feet, or legs.  Your baby makes fewer than 10 movements in 2 hours.  You have severe headaches that do not go away when you take medicine.  You have vision changes. Summary  The third trimester is from week 28 through week 40, months 7 through 9. The third trimester is a time when the unborn baby (fetus) is growing rapidly.  During the third trimester, your discomfort may increase as you and your baby continue to gain weight. You may have abdominal, leg, and back pain, sleeping problems, and an increased need to urinate.  During the third trimester your breasts will keep growing and they will continue to become tender. A yellow fluid (colostrum) may leak from your breasts. This is the first milk you are producing for your baby.  False labor is a condition in which you feel small, irregular tightenings of the muscles in the womb (contractions) that eventually go away. These are called Braxton Hicks contractions. Contractions may last for hours, days, or even weeks before true labor sets in.  Signs of labor can include: abdominal cramps; regular contractions that start at 10 minutes apart and become stronger and more frequent with time; watery or bloody mucus discharge that comes from the vagina; increased pelvic pressure and dull back pain; and leaking of amniotic fluid. This information is not intended to  replace advice given to you by your health care provider. Make sure you discuss any questions you have with your health care provider. Document Released: 01/05/2001 Document Revised: 06/19/2015 Document Reviewed: 03/14/2012 Elsevier Interactive Patient Education  2017 [employer].   Tdap Vaccine (Tetanus, Diphtheria and Pertussis): What You Need to Know 1. Why get vaccinated? Tetanus, diphtheria and pertussis are very serious diseases. Tdap vaccine can protect [place] from these diseases. And, Tdap vaccine given to pregnant women can protect newborn babies against pertussis. TETANUS (Lockjaw) is rare in the [place] today. It causes painful muscle tightening and stiffness, usually all over the body.  It can lead to tightening of muscles in the head and neck so you can't open your mouth, swallow, or sometimes even breathe. Tetanus kills about 1 out of 10 people who are infected even after receiving the best medical care. DIPHTHERIA is also rare in the [place] today. It can cause a thick coating to form in the back of the throat.  It can lead to breathing problems, heart failure, paralysis, and death. PERTUSSIS (Whooping Cough) causes severe coughing spells, which can cause difficulty breathing, vomiting and disturbed sleep.  It can also lead to weight loss, incontinence, and rib fractures. Up to 2 in 100 adolescents and 5 in 100 adults with pertussis are hospitalized or have complications, which could include pneumonia or death. These diseases are caused by bacteria. Diphtheria and pertussis are spread from person to person through secretions from coughing or sneezing. Tetanus enters the body through cuts, scratches, or wounds. Before vaccines, as many as 200,000 cases of diphtheria, 200,000 cases of pertussis, and hundreds of cases of tetanus, were reported in the [place] each year. Since vaccination began, reports of cases for tetanus and diphtheria have dropped by about 99% and for  pertussis by about 80%. 2. Tdap vaccine Tdap vaccine can protect adolescents and adults from tetanus, diphtheria, and pertussis. One dose of Tdap is routinely given at age 91 or 42. People who  did not get Tdap at that age should get it as soon as possible. Tdap is especially important for healthcare professionals and anyone having close contact with a baby younger than 12 months. Pregnant women should get a dose of Tdap during every pregnancy, to protect the newborn from pertussis. Infants are most at risk for severe, life-threatening complications from pertussis. Another vaccine, called Td, protects against tetanus and diphtheria, but not pertussis. A Td booster should be given every 10 years. Tdap may be given as one of these boosters if you have never gotten Tdap before. Tdap may also be given after a severe cut or burn to prevent tetanus infection. Your doctor or the person giving you the vaccine can give you more information. Tdap may safely be given at the same time as other vaccines. 3. Some people should not get this vaccine  A person who has ever had a life-threatening allergic reaction after a previous dose of any diphtheria, tetanus or pertussis containing vaccine, OR has a severe allergy to any part of this vaccine, should not get Tdap vaccine. Tell the person giving the vaccine about any severe allergies.  Anyone who had coma or long repeated seizures within 7 days after a childhood dose of DTP or DTaP, or a previous dose of Tdap, should not get Tdap, unless a cause other than the vaccine was found. They can still get Td.  Talk to your doctor if you:  have seizures or another nervous system problem,  had severe pain or swelling after any vaccine containing diphtheria, tetanus or pertussis,  ever had a condition called Guillain-Barr Syndrome (GBS),  aren't feeling well on the day the shot is scheduled. 4. Risks With any medicine, including vaccines, there is a chance of side  effects. These are usually mild and go away on their own. Serious reactions are also possible but are rare. Most people who get Tdap vaccine do not have any problems with it. Mild problems following Tdap:  (Did not interfere with activities)  Pain where the shot was given (about 3 in 4 adolescents or 2 in 3 adults)  Redness or swelling where the shot was given (about 1 person in 5)  Mild fever of at least 100.72F (up to about 1 in 25 adolescents or 1 in 100 adults)  Headache (about 3 or 4 people in 10)  Tiredness (about 1 person in 3 or 4)  Nausea, vomiting, diarrhea, stomach ache (up to 1 in 4 adolescents or 1 in 10 adults)  Chills, sore joints (about 1 person in 10)  Body aches (about 1 person in 3 or 4)  Rash, swollen glands (uncommon) Moderate problems following Tdap:  (Interfered with activities, but did not require medical attention)  Pain where the shot was given (up to 1 in 5 or 6)  Redness or swelling where the shot was given (up to about 1 in 16 adolescents or 1 in 12 adults)  Fever over 102F (about 1 in 100 adolescents or 1 in 250 adults)  Headache (about 1 in 7 adolescents or 1 in 10 adults)  Nausea, vomiting, diarrhea, stomach ache (up to 1 or 3 people in 100)  Swelling of the entire arm where the shot was given (up to about 1 in 500). Severe problems following Tdap:  (Unable to perform usual activities; required medical attention)  Swelling, severe pain, bleeding and redness in the arm where the shot was given (rare). Problems that could happen after any vaccine:   People sometimes  faint after a medical procedure, including vaccination. Sitting or lying down for about 15 minutes can help prevent fainting, and injuries caused by a fall. Tell your doctor if you feel dizzy, or have vision changes or ringing in the ears.  Some people get severe pain in the shoulder and have difficulty moving the arm where a shot was given. This happens very rarely.  Any  medication can cause a severe allergic reaction. Such reactions from a vaccine are very rare, estimated at fewer than 1 in a million doses, and would happen within a few minutes to a few hours after the vaccination. As with any medicine, there is a very remote chance of a vaccine causing a serious injury or death. The safety of vaccines is always being monitored. For more information, visit: http://www.aguilar.org/ 5. What if there is a serious problem? What should I look for?  Look for anything that concerns you, such as signs of a severe allergic reaction, very high fever, or unusual behavior. Signs of a severe allergic reaction can include hives, swelling of the face and throat, difficulty breathing, a fast heartbeat, dizziness, and weakness. These would usually start a few minutes to a few hours after the vaccination. What should I do?   If you think it is a severe allergic reaction or other emergency that can't wait, call 9-1-1 or get the person to the nearest hospital. Otherwise, call your doctor.  Afterward, the reaction should be reported to the Vaccine Adverse Event Reporting System (VAERS). Your doctor might file this report, or you can do it yourself through the VAERS web site at www.vaers.SamedayNews.es, or by calling [phone].  VAERS does not give medical advice. 6. The National Vaccine Injury Compensation Program The [employer] Vaccine Injury Compensation Program (VICP) is a federal program that was created to compensate people who may have been injured by certain vaccines. Persons who believe they may have been injured by a vaccine can learn about the program and about filing a claim by calling (862)749-7607 or visiting the [place] website at GoldCloset.com.ee. There is a time limit to file a claim for compensation. 7. How can I learn more?  Ask your doctor. He or she can give you the vaccine package insert or suggest other sources of information.  Call your local or  state health department.  Contact the Centers for Disease Control and Prevention (CDC):  Call 424-263-4806 (1-800-CDC-INFO) or  Visit CDC's website at http://hunter.com/ CDC Tdap Vaccine VIS (03/20/13) This information is not intended to replace advice given to you by your health care provider. Make sure you discuss any questions you have with your health care provider. Document Released: 07/13/2011 Document Revised: 10/02/2015 Document Reviewed: 10/02/2015 Elsevier Interactive Patient Education  2017 [employer].

## 2016-06-04 LAB — POCT URINALYSIS DIP (DEVICE)
Bilirubin Urine: NEGATIVE
Glucose, UA: NEGATIVE mg/dL
HGB URINE DIPSTICK: NEGATIVE
Ketones, ur: NEGATIVE mg/dL
LEUKOCYTES UA: NEGATIVE
NITRITE: NEGATIVE
PH: 6 (ref 5.0–8.0)
PROTEIN: NEGATIVE mg/dL
SPECIFIC GRAVITY, URINE: 1.025 (ref 1.005–1.030)
UROBILINOGEN UA: 0.2 mg/dL (ref 0.0–1.0)

## 2016-06-24 ENCOUNTER — Ambulatory Visit (HOSPITAL_COMMUNITY)
Admission: RE | Admit: 2016-06-24 | Discharge: 2016-06-24 | Disposition: A | Discharge status: Home / Self Care | Payer: Medicaid Other | Source: Ambulatory Visit | Attending: Nurse Practitioner | Admitting: Nurse Practitioner

## 2016-06-24 ENCOUNTER — Encounter (HOSPITAL_COMMUNITY): Payer: Self-pay

## 2016-06-24 ENCOUNTER — Other Ambulatory Visit (HOSPITAL_COMMUNITY): Payer: Self-pay | Admitting: Maternal and Fetal Medicine

## 2016-06-24 DIAGNOSIS — Z3A27 27 weeks gestation of pregnancy: Secondary | ICD-10-CM | POA: Diagnosis not present

## 2016-06-24 DIAGNOSIS — O24415 Gestational diabetes mellitus in pregnancy, controlled by oral hypoglycemic drugs: Secondary | ICD-10-CM | POA: Diagnosis present

## 2016-06-24 DIAGNOSIS — O0993 Supervision of high risk pregnancy, unspecified, third trimester: Secondary | ICD-10-CM

## 2016-06-24 DIAGNOSIS — O99212 Obesity complicating pregnancy, second trimester: Secondary | ICD-10-CM

## 2016-06-25 ENCOUNTER — Encounter: Payer: Self-pay | Admitting: *Deleted

## 2016-06-25 ENCOUNTER — Other Ambulatory Visit (HOSPITAL_COMMUNITY): Payer: Self-pay | Admitting: *Deleted

## 2016-06-25 DIAGNOSIS — O24415 Gestational diabetes mellitus in pregnancy, controlled by oral hypoglycemic drugs: Secondary | ICD-10-CM

## 2016-06-25 NOTE — Progress Notes (Signed)
Received a fax clarifying glyburide order. Hand written on rx for glyburide - states should say 1/2 tab in am and 1 tab in pm.  Per chart review our last order on 5/10 does say 1/2 tab in am and 1 tab in pm.  I called and left verbal order to clarify on voicemail for pharmacy

## 2016-07-01 ENCOUNTER — Ambulatory Visit (INDEPENDENT_AMBULATORY_CARE_PROVIDER_SITE_OTHER): Payer: Medicaid Other | Admitting: Obstetrics and Gynecology

## 2016-07-01 VITALS — BP 119/70 | HR 78 | Wt 181.0 lb

## 2016-07-01 DIAGNOSIS — O24419 Gestational diabetes mellitus in pregnancy, unspecified control: Secondary | ICD-10-CM | POA: Diagnosis not present

## 2016-07-01 DIAGNOSIS — E669 Obesity, unspecified: Secondary | ICD-10-CM

## 2016-07-01 DIAGNOSIS — O0993 Supervision of high risk pregnancy, unspecified, third trimester: Secondary | ICD-10-CM

## 2016-07-01 DIAGNOSIS — O99213 Obesity complicating pregnancy, third trimester: Secondary | ICD-10-CM | POA: Diagnosis not present

## 2016-07-01 DIAGNOSIS — Z23 Encounter for immunization: Secondary | ICD-10-CM

## 2016-07-01 DIAGNOSIS — O0990 Supervision of high risk pregnancy, unspecified, unspecified trimester: Secondary | ICD-10-CM

## 2016-07-01 DIAGNOSIS — O99210 Obesity complicating pregnancy, unspecified trimester: Secondary | ICD-10-CM

## 2016-07-01 MED ORDER — GLYBURIDE 2.5 MG PO TABS: ORAL_TABLET | ORAL | 3 refills | Status: DC

## 2016-07-01 MED ORDER — TETANUS-DIPHTH-ACELL PERTUSSIS 5-2.5-18.5 LF-MCG/0.5 IM SUSP
0.5000 mL | Freq: Once | INTRAMUSCULAR | Status: AC
  Administered 2016-07-01: 0.5 mL via INTRAMUSCULAR

## 2016-07-01 NOTE — Progress Notes (Signed)
28 week labs and tdap today.

## 2016-07-01 NOTE — Progress Notes (Cosign Needed)
   PRENATAL VISIT NOTE  Subjective:  Catherine Houston is a 36 y.o. G2P1001 at [redacted]w[redacted]d being seen today for ongoing prenatal care.  She is currently monitored for the following issues for this high-risk pregnancy and has GDM, class A2; Supervision of high-risk pregnancy; Hereditary disease in family possibly affecting fetus, affecting management of mother, antepartum condition or complication, not applicable or unspecified fetus; History of HSV-1 infection; BMI 31.0-31.9,adult; and Obesity in pregnancy on her problem list.  Patient reports no complaints.  Contractions: Not present. Vag. Bleeding: None.  Movement: Present. Denies leaking of fluid.   The following portions of the patient's history were reviewed and updated as appropriate: allergies, current medications, past family history, past medical history, past social history, past surgical history and problem list. Problem list updated.  Objective:   Vitals:   07/01/16 1423  BP: 119/70  Pulse: 78  Weight: 181 lb (82.1 kg)    Fetal Status: Fetal Heart Rate (bpm): 146 Fundal Height: 28 cm Movement: Present     General:  Alert, oriented and cooperative. Patient is in no acute distress.  Skin: Skin is warm and dry. No rash noted.   Cardiovascular: Normal heart rate noted  Respiratory: Normal respiratory effort, no problems with respiration noted  Abdomen: Soft, gravid, appropriate for gestational age. Pain/Pressure: Absent     Pelvic:  Cervical exam deferred        Extremities: Normal range of motion.  Edema: Trace  Mental Status: Normal mood and affect. Normal behavior. Normal judgment and thought content.   Assessment and Plan:  Pregnancy: G2P1001 at [redacted]w[redacted]d  1. GDM, class A2 Majority of blood sugars in range. Fasting improved, postprandial dinner increased. Patient will switch back to taking Glyburide at dinner rather than at bedtime. May have to increase Glyburide dosage within the next few weeks given GA.  - glyBURIDE (DIABETA) 2.5 MG  tablet; 1/2 tablet with breakfast, and 1 tablet with dinner  2. Supervision of high risk pregnancy, antepartum - CBC - RPR - HIV antibody (with reflex)  3. Need for Tdap vaccination - Tdap (BOOSTRIX) injection 0.5 mL; Inject 0.5 mLs into the muscle once.  Preterm labor symptoms and general obstetric precautions including but not limited to vaginal bleeding, contractions, leaking of fluid and fetal movement were reviewed in detail with the patient. Please refer to After Visit Summary for other counseling recommendations.  No Follow-up on file.   Talmage Nap, Medical Student

## 2016-07-01 NOTE — Progress Notes (Signed)
Prenatal Visit Note Date: 07/01/2016 Clinic: [facility]  Subjective:  Catherine Houston is a 36 y.o. G2P1001 at [redacted]w[redacted]d being seen today for ongoing prenatal care.  She is currently monitored for the following issues for this high-risk pregnancy and has GDM, class A2; Supervision of high-risk pregnancy; Hereditary disease in family possibly affecting fetus, affecting management of mother, antepartum condition or complication, not applicable or unspecified fetus; History of HSV-1 infection; BMI 31.0-31.9,adult; and Obesity in pregnancy on her problem list.  Patient reports no complaints.   Contractions: Not present. Vag. Bleeding: None.  Movement: Present. Denies leaking of fluid.   The following portions of the patient's history were reviewed and updated as appropriate: allergies, current medications, past family history, past medical history, past social history, past surgical history and problem list. Problem list updated.  Objective:   Vitals:   07/01/16 1423  BP: 119/70  Pulse: 78  Weight: 181 lb (82.1 kg)    Fetal Status: Fetal Heart Rate (bpm): 146 Fundal Height: 28 cm Movement: Present     General:  Alert, oriented and cooperative. Patient is in no acute distress.  Skin: Skin is warm and dry. No rash noted.   Cardiovascular: Normal heart rate noted  Respiratory: Normal respiratory effort, no problems with respiration noted  Abdomen: Soft, gravid, appropriate for gestational age. Pain/Pressure: Absent     Pelvic:  Cervical exam deferred        Extremities: Normal range of motion.  Edema: Trace  Mental Status: Normal mood and affect. Normal behavior. Normal judgment and thought content.   Urinalysis:      Assessment and Plan:  Pregnancy: G2P1001 at [redacted]w[redacted]d  1. Supervision of high risk pregnancy, antepartum D/w pt re: BC at nv - CBC - RPR - HIV antibody (with reflex)  2. Need for Tdap vaccination - Tdap (BOOSTRIX) injection 0.5 mL; Inject 0.5 mLs into the  muscle once.  3. GDM, class A2 On glyburide 1.25 with breakfast and 2.5 qhs. Her fastings are good and so are her 2hr PP breakfast and lunch. Her 2hr PP dinners are >25% elevated in the 120s-140s; she was switched to her PM dose of glyburide from dinner to qhs at her last visit to help with her fastings which were approxiatlely 1/3 in the high 90s. I told her to switch back to her PM dose with dinner and will keep a close eye on her fastings since they weren't too bad before. Given GA, I told her that we may have increase her dose of glyburide and told her that we recommend starting 2x/wk testing at 32wk. Growth u/s late may normal efw with normal afi and ac and rpt already scheduled.  - glyBURIDE (DIABETA) 2.5 MG tablet; Take 1/2 a tablet in the morning and 1 tablet at dinner  Dispense: 60 tablet; Refill: 3   Preterm labor symptoms and general obstetric precautions including but not limited to vaginal bleeding, contractions, leaking of fluid and fetal movement were reviewed in detail with the patient. Please refer to After Visit Summary for other counseling recommendations.  No Follow-up on file.   Aletha Halim, MD

## 2016-07-02 LAB — CBC
HEMOGLOBIN: 11.7 g/dL (ref 11.1–15.9)
Hematocrit: 34.4 % (ref 34.0–46.6)
MCH: 28.4 pg (ref 26.6–33.0)
MCHC: 34 g/dL (ref 31.5–35.7)
MCV: 84 fL (ref 79–97)
Platelets: 338 10*3/uL (ref 150–379)
RBC: 4.12 x10E6/uL (ref 3.77–5.28)
RDW: 14.3 % (ref 12.3–15.4)
WBC: 9.8 10*3/uL (ref 3.4–10.8)

## 2016-07-02 LAB — HIV ANTIBODY (ROUTINE TESTING W REFLEX): HIV Screen 4th Generation wRfx: NONREACTIVE

## 2016-07-02 LAB — RPR: RPR: NONREACTIVE

## 2016-07-19 ENCOUNTER — Ambulatory Visit (INDEPENDENT_AMBULATORY_CARE_PROVIDER_SITE_OTHER): Payer: Medicaid Other | Admitting: Obstetrics and Gynecology

## 2016-07-19 VITALS — BP 121/64 | HR 87 | Wt 181.7 lb

## 2016-07-19 DIAGNOSIS — A6009 Herpesviral infection of other urogenital tract: Secondary | ICD-10-CM | POA: Diagnosis not present

## 2016-07-19 DIAGNOSIS — O0993 Supervision of high risk pregnancy, unspecified, third trimester: Secondary | ICD-10-CM | POA: Diagnosis present

## 2016-07-19 DIAGNOSIS — O24419 Gestational diabetes mellitus in pregnancy, unspecified control: Secondary | ICD-10-CM | POA: Diagnosis not present

## 2016-07-19 DIAGNOSIS — B009 Herpesviral infection, unspecified: Secondary | ICD-10-CM

## 2016-07-19 DIAGNOSIS — O98313 Other infections with a predominantly sexual mode of transmission complicating pregnancy, third trimester: Secondary | ICD-10-CM | POA: Diagnosis not present

## 2016-07-19 LAB — POCT URINALYSIS DIP (DEVICE)
BILIRUBIN URINE: NEGATIVE
Glucose, UA: NEGATIVE mg/dL
HGB URINE DIPSTICK: NEGATIVE
KETONES UR: 15 mg/dL — AB
Leukocytes, UA: NEGATIVE
Nitrite: NEGATIVE
PH: 5.5 (ref 5.0–8.0)
PROTEIN: NEGATIVE mg/dL
Specific Gravity, Urine: 1.025 (ref 1.005–1.030)
Urobilinogen, UA: 0.2 mg/dL (ref 0.0–1.0)

## 2016-07-19 NOTE — Progress Notes (Signed)
Breastfeeding tip reviewed

## 2016-07-19 NOTE — Progress Notes (Signed)
Subjective:  Catherine Houston is a 36 y.o. G2P1001 at [redacted]w[redacted]d being seen today for ongoing prenatal care.  She is currently monitored for the following issues for this high-risk pregnancy and has GDM, class A2; Supervision of high-risk pregnancy; Hereditary disease in family possibly affecting fetus, affecting management of mother, antepartum condition or complication, not applicable or unspecified fetus; History of HSV-1 infection; BMI 31.0-31.9,adult; and Obesity in pregnancy on her problem list.  Patient reports no complaints.  Contractions: Not present. Vag. Bleeding: None.  Movement: Present. Denies leaking of fluid.   The following portions of the patient's history were reviewed and updated as appropriate: allergies, current medications, past family history, past medical history, past social history, past surgical history and problem list. Problem list updated.  Objective:   Vitals:   07/19/16 1006  BP: 121/64  Pulse: 87  Weight: 181 lb 11.2 oz (82.4 kg)    Fetal Status: Fetal Heart Rate (bpm): 145   Movement: Present     General:  Alert, oriented and cooperative. Patient is in no acute distress.  Skin: Skin is warm and dry. No rash noted.   Cardiovascular: Normal heart rate noted  Respiratory: Normal respiratory effort, no problems with respiration noted  Abdomen: Soft, gravid, appropriate for gestational age. Pain/Pressure: Absent     Pelvic:  Cervical exam deferred        Extremities: Normal range of motion.  Edema: None  Mental Status: Normal mood and affect. Normal behavior. Normal judgment and thought content.   Urinalysis:      Assessment and Plan:  Pregnancy: G2P1001 at [redacted]w[redacted]d  1. History of HSV-1 infection Prophylactic treatment starting at 36 weeks  2. Supervision of high risk pregnancy in third trimester   3. GDM, class A2 Change in medication at last visit has helped some but still with elevated after dinner and now elevated fasting. Will change back to taking Diabeta  at bedtime and change AM dose to lunch time Diet portions reviewed and encouraged to keep a food journal as well Start twice weekly testing next week  Preterm labor symptoms and general obstetric precautions including but not limited to vaginal bleeding, contractions, leaking of fluid and fetal movement were reviewed in detail with the patient. Please refer to After Visit Summary for other counseling recommendations.  Return in about 1 week (around 07/26/2016) for OB visit.   Chancy Milroy, MD

## 2016-07-19 NOTE — Patient Instructions (Signed)
Third Trimester of Pregnancy The third trimester is from week 28 through week 40 (months 7 through 9). The third trimester is a time when the unborn baby (fetus) is growing rapidly. At the end of the ninth month, the fetus is about 20 inches in length and weighs 6-10 pounds. Body changes during your third trimester Your body will continue to go through many changes during pregnancy. The changes vary from woman to woman. During the third trimester:  Your weight will continue to increase. You can expect to gain 25-35 pounds (11-16 kg) by the end of the pregnancy.  You may begin to get stretch marks on your hips, abdomen, and breasts.  You may urinate more often because the fetus is moving lower into your pelvis and pressing on your bladder.  You may develop or continue to have heartburn. This is caused by increased hormones that slow down muscles in the digestive tract.  You may develop or continue to have constipation because increased hormones slow digestion and cause the muscles that push waste through your intestines to relax.  You may develop hemorrhoids. These are swollen veins (varicose veins) in the rectum that can itch or be painful.  You may develop swollen, bulging veins (varicose veins) in your legs.  You may have increased body aches in the pelvis, back, or thighs. This is due to weight gain and increased hormones that are relaxing your joints.  You may have changes in your hair. These can include thickening of your hair, rapid growth, and changes in texture. Some women also have hair loss during or after pregnancy, or hair that feels dry or thin. Your hair will most likely return to normal after your baby is born.  Your breasts will continue to grow and they will continue to become tender. A yellow fluid (colostrum) may leak from your breasts. This is the first milk you are producing for your baby.  Your belly button may stick out.  You may notice more swelling in your hands,  face, or ankles.  You may have increased tingling or numbness in your hands, arms, and legs. The skin on your belly may also feel numb.  You may feel short of breath because of your expanding uterus.  You may have more problems sleeping. This can be caused by the size of your belly, increased need to urinate, and an increase in your body's metabolism.  You may notice the fetus "dropping," or moving lower in your abdomen (lightening).  You may have increased vaginal discharge.  You may notice your joints feel loose and you may have pain around your pelvic bone.  What to expect at prenatal visits You will have prenatal exams every 2 weeks until week 36. Then you will have weekly prenatal exams. During a routine prenatal visit:  You will be weighed to make sure you and the baby are growing normally.  Your blood pressure will be taken.  Your abdomen will be measured to track your baby's growth.  The fetal heartbeat will be listened to.  Any test results from the previous visit will be discussed.  You may have a cervical check near your due date to see if your cervix has softened or thinned (effaced).  You will be tested for Group B streptococcus. This happens between 35 and 37 weeks.  Your health care provider may ask you:  What your birth plan is.  How you are feeling.  If you are feeling the baby move.  If you have had   any abnormal symptoms, such as leaking fluid, bleeding, severe headaches, or abdominal cramping.  If you are using any tobacco products, including cigarettes, chewing tobacco, and electronic cigarettes.  If you have any questions.  Other tests or screenings that may be performed during your third trimester include:  Blood tests that check for low iron levels (anemia).  Fetal testing to check the health, activity level, and growth of the fetus. Testing is done if you have certain medical conditions or if there are problems during the  pregnancy.  Nonstress test (NST). This test checks the health of your baby to make sure there are no signs of problems, such as the baby not getting enough oxygen. During this test, a belt is placed around your belly. The baby is made to move, and its heart rate is monitored during movement.  What is false labor? False labor is a condition in which you feel small, irregular tightenings of the muscles in the womb (contractions) that usually go away with rest, changing position, or drinking water. These are called Braxton Hicks contractions. Contractions may last for hours, days, or even weeks before true labor sets in. If contractions come at regular intervals, become more frequent, increase in intensity, or become painful, you should see your health care provider. What are the signs of labor?  Abdominal cramps.  Regular contractions that start at 10 minutes apart and become stronger and more frequent with time.  Contractions that start on the top of the uterus and spread down to the lower abdomen and back.  Increased pelvic pressure and dull back pain.  A watery or bloody mucus discharge that comes from the vagina.  Leaking of amniotic fluid. This is also known as your "water breaking." It could be a slow trickle or a gush. Let your health care provider know if it has a color or strange odor. If you have any of these signs, call your health care provider right away, even if it is before your due date. Follow these instructions at home: Medicines  Follow your health care provider's instructions regarding medicine use. Specific medicines may be either safe or unsafe to take during pregnancy.  Take a prenatal vitamin that contains at least 600 micrograms (mcg) of folic acid.  If you develop constipation, try taking a stool softener if your health care provider approves. Eating and drinking  Eat a balanced diet that includes fresh fruits and vegetables, whole grains, good sources of protein  such as meat, eggs, or tofu, and low-fat dairy. Your health care provider will help you determine the amount of weight gain that is right for you.  Avoid raw meat and uncooked cheese. These carry germs that can cause birth defects in the baby.  If you have low calcium intake from food, talk to your health care provider about whether you should take a daily calcium supplement.  Eat four or five small meals rather than three large meals a day.  Limit foods that are high in fat and processed sugars, such as fried and sweet foods.  To prevent constipation: ? Drink enough fluid to keep your urine clear or pale yellow. ? Eat foods that are high in fiber, such as fresh fruits and vegetables, whole grains, and beans. Activity  Exercise only as directed by your health care provider. Most women can continue their usual exercise routine during pregnancy. Try to exercise for 30 minutes at least 5 days a week. Stop exercising if you experience uterine contractions.  Avoid heavy   lifting.  Do not exercise in extreme heat or humidity, or at high altitudes.  Wear low-heel, comfortable shoes.  Practice good posture.  You may continue to have sex unless your health care provider tells you otherwise. Relieving pain and discomfort  Take frequent breaks and rest with your legs elevated if you have leg cramps or low back pain.  Take warm sitz baths to soothe any pain or discomfort caused by hemorrhoids. Use hemorrhoid cream if your health care provider approves.  Wear a good support bra to prevent discomfort from breast tenderness.  If you develop varicose veins: ? Wear support pantyhose or compression stockings as told by your healthcare provider. ? Elevate your feet for 15 minutes, 3-4 times a day. Prenatal care  Write down your questions. Take them to your prenatal visits.  Keep all your prenatal visits as told by your health care provider. This is important. Safety  Wear your seat belt at  all times when driving.  Make a list of emergency phone numbers, including numbers for family, friends, the hospital, and police and fire departments. General instructions  Avoid cat litter boxes and soil used by cats. These carry germs that can cause birth defects in the baby. If you have a cat, ask someone to clean the litter box for you.  Do not travel far distances unless it is absolutely necessary and only with the approval of your health care provider.  Do not use hot tubs, steam rooms, or saunas.  Do not drink alcohol.  Do not use any products that contain nicotine or tobacco, such as cigarettes and e-cigarettes. If you need help quitting, ask your health care provider.  Do not use any medicinal herbs or unprescribed drugs. These chemicals affect the formation and growth of the baby.  Do not douche or use tampons or scented sanitary pads.  Do not cross your legs for long periods of time.  To prepare for the arrival of your baby: ? Take prenatal classes to understand, practice, and ask questions about labor and delivery. ? Make a trial run to the hospital. ? Visit the hospital and tour the maternity area. ? Arrange for maternity or paternity leave through employers. ? Arrange for family and friends to take care of pets while you are in the hospital. ? Purchase a rear-facing car seat and make sure you know how to install it in your car. ? Pack your hospital bag. ? Prepare the baby's nursery. Make sure to remove all pillows and stuffed animals from the baby's crib to prevent suffocation.  Visit your dentist if you have not gone during your pregnancy. Use a soft toothbrush to brush your teeth and be gentle when you floss. Contact a health care provider if:  You are unsure if you are in labor or if your water has broken.  You become dizzy.  You have mild pelvic cramps, pelvic pressure, or nagging pain in your abdominal area.  You have lower back pain.  You have persistent  nausea, vomiting, or diarrhea.  You have an unusual or bad smelling vaginal discharge.  You have pain when you urinate. Get help right away if:  Your water breaks before 37 weeks.  You have regular contractions less than 5 minutes apart before 37 weeks.  You have a fever.  You are leaking fluid from your vagina.  You have spotting or bleeding from your vagina.  You have severe abdominal pain or cramping.  You have rapid weight loss or weight gain.    You have shortness of breath with chest pain.  You notice sudden or extreme swelling of your face, hands, ankles, feet, or legs.  Your baby makes fewer than 10 movements in 2 hours.  You have severe headaches that do not go away when you take medicine.  You have vision changes. Summary  The third trimester is from week 28 through week 40, months 7 through 9. The third trimester is a time when the unborn baby (fetus) is growing rapidly.  During the third trimester, your discomfort may increase as you and your baby continue to gain weight. You may have abdominal, leg, and back pain, sleeping problems, and an increased need to urinate.  During the third trimester your breasts will keep growing and they will continue to become tender. A yellow fluid (colostrum) may leak from your breasts. This is the first milk you are producing for your baby.  False labor is a condition in which you feel small, irregular tightenings of the muscles in the womb (contractions) that eventually go away. These are called Braxton Hicks contractions. Contractions may last for hours, days, or even weeks before true labor sets in.  Signs of labor can include: abdominal cramps; regular contractions that start at 10 minutes apart and become stronger and more frequent with time; watery or bloody mucus discharge that comes from the vagina; increased pelvic pressure and dull back pain; and leaking of amniotic fluid. This information is not intended to replace advice  given to you by your health care provider. Make sure you discuss any questions you have with your health care provider. Document Released: 01/05/2001 Document Revised: 06/19/2015 Document Reviewed: 03/14/2012 Elsevier Interactive Patient Education  2017 [employer]  

## 2016-07-23 ENCOUNTER — Other Ambulatory Visit (HOSPITAL_COMMUNITY): Payer: Self-pay | Admitting: Maternal and Fetal Medicine

## 2016-07-23 ENCOUNTER — Other Ambulatory Visit (HOSPITAL_COMMUNITY): Payer: Self-pay | Admitting: *Deleted

## 2016-07-23 ENCOUNTER — Encounter (HOSPITAL_COMMUNITY): Payer: Self-pay

## 2016-07-23 ENCOUNTER — Ambulatory Visit (HOSPITAL_COMMUNITY)
Admission: RE | Admit: 2016-07-23 | Discharge: 2016-07-23 | Disposition: A | Discharge status: Home / Self Care | Payer: Medicaid Other | Source: Ambulatory Visit | Attending: Obstetrics and Gynecology | Admitting: Obstetrics and Gynecology

## 2016-07-23 DIAGNOSIS — Z3A31 31 weeks gestation of pregnancy: Secondary | ICD-10-CM | POA: Diagnosis not present

## 2016-07-23 DIAGNOSIS — O24415 Gestational diabetes mellitus in pregnancy, controlled by oral hypoglycemic drugs: Secondary | ICD-10-CM

## 2016-07-23 DIAGNOSIS — O99213 Obesity complicating pregnancy, third trimester: Secondary | ICD-10-CM

## 2016-07-23 DIAGNOSIS — Z362 Encounter for other antenatal screening follow-up: Secondary | ICD-10-CM | POA: Insufficient documentation

## 2016-07-23 DIAGNOSIS — O09893 Supervision of other high risk pregnancies, third trimester: Secondary | ICD-10-CM | POA: Insufficient documentation

## 2016-07-26 ENCOUNTER — Ambulatory Visit (INDEPENDENT_AMBULATORY_CARE_PROVIDER_SITE_OTHER): Payer: Medicaid Other | Admitting: Advanced Practice Midwife

## 2016-07-26 VITALS — BP 108/59 | HR 84 | Wt 180.7 lb

## 2016-07-26 DIAGNOSIS — O0993 Supervision of high risk pregnancy, unspecified, third trimester: Secondary | ICD-10-CM

## 2016-07-26 DIAGNOSIS — O24419 Gestational diabetes mellitus in pregnancy, unspecified control: Secondary | ICD-10-CM

## 2016-07-26 LAB — POCT URINALYSIS DIP (DEVICE)
Bilirubin Urine: NEGATIVE
GLUCOSE, UA: NEGATIVE mg/dL
Hgb urine dipstick: NEGATIVE
NITRITE: NEGATIVE
PH: 6 (ref 5.0–8.0)
Protein, ur: NEGATIVE mg/dL
Specific Gravity, Urine: 1.025 (ref 1.005–1.030)
Urobilinogen, UA: 0.2 mg/dL (ref 0.0–1.0)

## 2016-07-26 NOTE — Progress Notes (Signed)
Pt reports increased amount of heartburn x1 week. She also reports improvement in blood sugars since changing medication regimen (glyburide) last week.

## 2016-07-29 ENCOUNTER — Ambulatory Visit: Payer: Self-pay

## 2016-07-29 ENCOUNTER — Ambulatory Visit (INDEPENDENT_AMBULATORY_CARE_PROVIDER_SITE_OTHER): Payer: Medicaid Other | Admitting: *Deleted

## 2016-07-29 VITALS — BP 113/72 | HR 85

## 2016-07-29 DIAGNOSIS — O24419 Gestational diabetes mellitus in pregnancy, unspecified control: Secondary | ICD-10-CM | POA: Diagnosis present

## 2016-07-29 NOTE — Progress Notes (Signed)
Pt informed that the ultrasound is considered a limited OB ultrasound and is not intended to be a complete ultrasound exam.  Patient also informed that the ultrasound is not being completed with the intent of assessing for fetal or placental anomalies or any pelvic abnormalities.  Explained that the purpose of today's ultrasound is to assess for presentation and amniotic fluid volume.  Patient acknowledges the purpose of the exam and the limitations of the study.    

## 2016-07-29 NOTE — Progress Notes (Signed)
   PRENATAL VISIT NOTE  Subjective:  Catherine Houston is a 36 y.o. G2P1001 at [redacted]w[redacted]d being seen today for ongoing prenatal care.  She is currently monitored for the following issues for this high-risk pregnancy and has GDM, class A2; Supervision of high-risk pregnancy; Hereditary disease in family possibly affecting fetus, affecting management of mother, antepartum condition or complication, not applicable or unspecified fetus; History of HSV-1 infection; BMI 31.0-31.9,adult; and Obesity in pregnancy on her problem list.  Patient reports heartburn.  Contractions: Not present. Vag. Bleeding: None.  Movement: Present. Denies leaking of fluid.   The following portions of the patient's history were reviewed and updated as appropriate: allergies, current medications, past family history, past medical history, past social history, past surgical history and problem list. Problem list updated.  Objective:   Vitals:   07/26/16 1050  BP: (!) 108/59  Pulse: 84  Weight: 180 lb 11.2 oz (82 kg)    Fetal Status: Fetal Heart Rate (bpm): NST reactive Fundal Height: 32 cm Movement: Present     General:  Alert, oriented and cooperative. Patient is in no acute distress.  Skin: Skin is warm and dry. No rash noted.   Cardiovascular: Normal heart rate noted  Respiratory: Normal respiratory effort, no problems with respiration noted  Abdomen: Soft, gravid, appropriate for gestational age. Pain/Pressure: Absent     Pelvic:  Cervical exam deferred        Extremities: Normal range of motion.  Edema: None  Mental Status: Normal mood and affect. Normal behavior. Normal judgment and thought content.   All but 1-2 CBGs in range.   Assessment and Plan:  Pregnancy: G2P1001 at [redacted]w[redacted]d  1. GDM, class A2 - Continue Glyburide  2. Supervision of high risk pregnancy in third trimester   Preterm labor symptoms and general obstetric precautions including but not limited to vaginal bleeding, contractions, leaking of fluid and  fetal movement were reviewed in detail with the patient. Please refer to After Visit Summary for other counseling recommendations.  Return in about 2 weeks (around 08/09/2016) for Ob fu and NST/BPP on 7/17 or 7/18; Ob fu and NST on 7/27.   Manya Silvas, CNM

## 2016-07-29 NOTE — Progress Notes (Signed)
7/5 NST reviewed and reactive

## 2016-08-03 ENCOUNTER — Ambulatory Visit (INDEPENDENT_AMBULATORY_CARE_PROVIDER_SITE_OTHER): Payer: Medicaid Other | Admitting: Advanced Practice Midwife

## 2016-08-03 ENCOUNTER — Ambulatory Visit: Payer: Self-pay

## 2016-08-03 VITALS — BP 119/75 | HR 94

## 2016-08-03 DIAGNOSIS — O24419 Gestational diabetes mellitus in pregnancy, unspecified control: Secondary | ICD-10-CM

## 2016-08-03 MED ORDER — GLYBURIDE 2.5 MG PO TABS: ORAL_TABLET | ORAL | 3 refills | Status: DC

## 2016-08-03 NOTE — Progress Notes (Signed)
Pt reports elevated blood sugars after lunch (120's x 2) and dinner (135-147 x3) since 7/7. Fastings are 80-90. She has started eating less carbs @ times due to the elevated blood sugars. She has been taking Glyburide 1.25 mg @ lunch and 2.5 mg @ bedtime as instructed by Dr. Rip Harbour on 6/25. Per consult w/Virginia Tamala Julian, pt advised to increase Glyburide to 2.5 mg (1 tablet) @ lunch and continue 2.5 mg @ bedtime. Pt also advised to follow diabetic diet as originally explained and her glyburide will be adjusted accordingly.  Pt voiced understanding.

## 2016-08-03 NOTE — Progress Notes (Signed)
BPP 8/8, NST reactive. 2 PP lunch and most PP dinner CBGs elevated. Increase Lunch Glyburide to 2.5mg .

## 2016-08-06 ENCOUNTER — Other Ambulatory Visit: Payer: Medicaid Other

## 2016-08-10 ENCOUNTER — Ambulatory Visit: Payer: Self-pay

## 2016-08-10 ENCOUNTER — Ambulatory Visit (INDEPENDENT_AMBULATORY_CARE_PROVIDER_SITE_OTHER): Payer: Medicaid Other | Admitting: Obstetrics and Gynecology

## 2016-08-10 VITALS — BP 116/72 | HR 84 | Wt 183.7 lb

## 2016-08-10 DIAGNOSIS — O24419 Gestational diabetes mellitus in pregnancy, unspecified control: Secondary | ICD-10-CM

## 2016-08-10 DIAGNOSIS — O0993 Supervision of high risk pregnancy, unspecified, third trimester: Secondary | ICD-10-CM | POA: Diagnosis not present

## 2016-08-10 MED ORDER — GLYBURIDE 2.5 MG PO TABS: ORAL_TABLET | ORAL | 3 refills | Status: DC

## 2016-08-10 NOTE — Progress Notes (Signed)
Pt informed that the ultrasound is considered a limited OB ultrasound and is not intended to be a complete ultrasound exam.  Patient also informed that the ultrasound is not being completed with the intent of assessing for fetal or placental anomalies or any pelvic abnormalities.  Explained that the purpose of today's ultrasound is to assess for presentation and amniotic fluid volume.  Patient acknowledges the purpose of the exam and the limitations of the study.    Pt states her evening CBG's are still elevated.

## 2016-08-10 NOTE — Progress Notes (Signed)
Prenatal Visit Note Date: 08/10/2016 Clinic: [facility]  Subjective:  Catherine Houston is a 36 y.o. G2P1001 at [redacted]w[redacted]d being seen today for ongoing prenatal care.  She is currently monitored for the following issues for this high-risk pregnancy and has GDM, class A2; Supervision of high-risk pregnancy; Hereditary disease in family possibly affecting fetus, affecting management of mother, antepartum condition or complication, not applicable or unspecified fetus; History of HSV-1 infection; BMI 31.0-31.9,adult; and Obesity in pregnancy on her problem list.  Patient reports no complaints.   Contractions: Not present. Vag. Bleeding: None.  Movement: Present. Denies leaking of fluid.   The following portions of the patient's history were reviewed and updated as appropriate: allergies, current medications, past family history, past medical history, past social history, past surgical history and problem list. Problem list updated.  Objective:   Vitals:   08/10/16 1352  BP: 116/72  Pulse: 84  Weight: 183 lb 11.2 oz (83.3 kg)    Fetal Status: Fetal Heart Rate (bpm): NST   Movement: Present     General:  Alert, oriented and cooperative. Patient is in no acute distress.  Skin: Skin is warm and dry. No rash noted.   Cardiovascular: Normal heart rate noted  Respiratory: Normal respiratory effort, no problems with respiration noted  Abdomen: Soft, gravid, appropriate for gestational age. Pain/Pressure: Absent     Pelvic:  Cervical exam deferred        Extremities: Normal range of motion.  Edema: None  Mental Status: Normal mood and affect. Normal behavior. Normal judgment and thought content.   Urinalysis:      Assessment and Plan:  Pregnancy: G2P1001 at [redacted]w[redacted]d  1. GDM, class A2 Currently on glyburide 2.5 with lunch and 2.5 qhs. AM fasting and 2hr PP breakfast are normal (<25% abnormal and only in the upper 90s for breakfast) with 2hr PP lunch and dinner in the 130s-140s for >25%  of values. Will adjust to 3.75 with lunch and 2.5 qhs. rNST (145 baseline, +accels, no decel, mod variability, toco quiet x 54m) today and normal afi. Rpt nst nv. Has rpt growth on 7/27 - Fetal nonstress test - US OB Limited - glyBURIDE (DIABETA) 2.5 MG tablet; Take 1.5 tablet @ lunch and 1 tablet @ bedtime.  Dispense: 60 tablet; Refill: 3  2. Supervision of high risk pregnancy in third trimester GBS 36wks  Preterm labor symptoms and general obstetric precautions including but not limited to vaginal bleeding, contractions, leaking of fluid and fetal movement were reviewed in detail with the patient. Please refer to After Visit Summary for other counseling recommendations.  Return in about 3 days (around 08/13/2016) for NST only;  7/24  NST only.   Aletha Halim, MD

## 2016-08-11 ENCOUNTER — Other Ambulatory Visit: Payer: Medicaid Other | Admitting: Obstetrics & Gynecology

## 2016-08-11 LAB — POCT URINALYSIS DIP (DEVICE)
BILIRUBIN URINE: NEGATIVE
GLUCOSE, UA: NEGATIVE mg/dL
Hgb urine dipstick: NEGATIVE
KETONES UR: NEGATIVE mg/dL
Leukocytes, UA: NEGATIVE
Nitrite: NEGATIVE
PH: 6 (ref 5.0–8.0)
PROTEIN: NEGATIVE mg/dL
Urobilinogen, UA: 0.2 mg/dL (ref 0.0–1.0)

## 2016-08-13 ENCOUNTER — Ambulatory Visit (INDEPENDENT_AMBULATORY_CARE_PROVIDER_SITE_OTHER): Payer: Medicaid Other | Admitting: Obstetrics & Gynecology

## 2016-08-13 VITALS — BP 118/66 | HR 90

## 2016-08-13 DIAGNOSIS — O24419 Gestational diabetes mellitus in pregnancy, unspecified control: Secondary | ICD-10-CM | POA: Diagnosis not present

## 2016-08-17 ENCOUNTER — Encounter: Payer: Medicaid Other | Admitting: Obstetrics & Gynecology

## 2016-08-17 ENCOUNTER — Ambulatory Visit (INDEPENDENT_AMBULATORY_CARE_PROVIDER_SITE_OTHER): Payer: Medicaid Other | Admitting: *Deleted

## 2016-08-17 ENCOUNTER — Ambulatory Visit: Payer: Self-pay

## 2016-08-17 VITALS — BP 118/76 | HR 95

## 2016-08-17 DIAGNOSIS — O24419 Gestational diabetes mellitus in pregnancy, unspecified control: Secondary | ICD-10-CM

## 2016-08-17 NOTE — Progress Notes (Signed)
Pt states she was ill yesterday with stomach upset and was not able to eat or drink very much fluids.  She feels better today. Encouraged fluid intake due to low normal AFI today. Pt voiced understanding. Weekly NST/BPP beginning today. [place] for growth scheduled on 7/27.

## 2016-08-17 NOTE — Progress Notes (Signed)
BPP 10/10 with reactive NST.  AFI low normal

## 2016-08-17 NOTE — Addendum Note (Signed)
Addended by: Truett Mainland on: 08/17/2016 02:50 PM   Modules accepted: Level of Service

## 2016-08-20 ENCOUNTER — Encounter (HOSPITAL_COMMUNITY): Payer: Self-pay

## 2016-08-20 ENCOUNTER — Ambulatory Visit (INDEPENDENT_AMBULATORY_CARE_PROVIDER_SITE_OTHER): Payer: Medicaid Other | Admitting: Obstetrics & Gynecology

## 2016-08-20 ENCOUNTER — Other Ambulatory Visit (HOSPITAL_COMMUNITY): Payer: Self-pay | Admitting: *Deleted

## 2016-08-20 ENCOUNTER — Encounter: Payer: Self-pay | Admitting: Obstetrics & Gynecology

## 2016-08-20 ENCOUNTER — Ambulatory Visit (HOSPITAL_COMMUNITY)
Admission: RE | Admit: 2016-08-20 | Discharge: 2016-08-20 | Disposition: A | Discharge status: Home / Self Care | Payer: Medicaid Other | Source: Ambulatory Visit | Attending: Obstetrics and Gynecology | Admitting: Obstetrics and Gynecology

## 2016-08-20 VITALS — BP 116/69 | HR 90 | Wt 184.5 lb

## 2016-08-20 DIAGNOSIS — O0993 Supervision of high risk pregnancy, unspecified, third trimester: Secondary | ICD-10-CM | POA: Diagnosis not present

## 2016-08-20 DIAGNOSIS — O99213 Obesity complicating pregnancy, third trimester: Secondary | ICD-10-CM | POA: Diagnosis not present

## 2016-08-20 DIAGNOSIS — O24415 Gestational diabetes mellitus in pregnancy, controlled by oral hypoglycemic drugs: Secondary | ICD-10-CM | POA: Insufficient documentation

## 2016-08-20 DIAGNOSIS — E669 Obesity, unspecified: Secondary | ICD-10-CM | POA: Insufficient documentation

## 2016-08-20 DIAGNOSIS — O24419 Gestational diabetes mellitus in pregnancy, unspecified control: Secondary | ICD-10-CM

## 2016-08-20 DIAGNOSIS — Z6831 Body mass index (BMI) 31.0-31.9, adult: Secondary | ICD-10-CM | POA: Insufficient documentation

## 2016-08-20 DIAGNOSIS — Z3A35 35 weeks gestation of pregnancy: Secondary | ICD-10-CM | POA: Diagnosis not present

## 2016-08-20 DIAGNOSIS — B009 Herpesviral infection, unspecified: Secondary | ICD-10-CM | POA: Diagnosis not present

## 2016-08-20 LAB — POCT URINALYSIS DIP (DEVICE)
BILIRUBIN URINE: NEGATIVE
Glucose, UA: NEGATIVE mg/dL
Hgb urine dipstick: NEGATIVE
KETONES UR: NEGATIVE mg/dL
NITRITE: NEGATIVE
Protein, ur: NEGATIVE mg/dL
Specific Gravity, Urine: 1.02 (ref 1.005–1.030)
Urobilinogen, UA: 0.2 mg/dL (ref 0.0–1.0)
pH: 6.5 (ref 5.0–8.0)

## 2016-08-20 MED ORDER — VALACYCLOVIR HCL 500 MG PO TABS: 500.0000 mg | ORAL_TABLET | Freq: Two times a day (BID) | ORAL | 6 refills | Status: DC

## 2016-08-20 NOTE — Patient Instructions (Signed)
Return to clinic for any scheduled appointments or obstetric concerns, or go to [facility] for evaluation  

## 2016-08-20 NOTE — Progress Notes (Signed)
   PRENATAL VISIT NOTE  Subjective:  Catherine Houston is a 36 y.o. G2P1001 at [redacted]w[redacted]d being seen today for ongoing prenatal care.  She is currently monitored for the following issues for this high-risk pregnancy and has GDM, class A2; Supervision of high-risk pregnancy; Hereditary disease in family possibly affecting fetus, affecting management of mother, antepartum condition or complication, not applicable or unspecified fetus; History of genital HSV-1 infection; BMI 31.0-31.9,adult; and Obesity in pregnancy on her problem list.  Patient reports no complaints.  Contractions: Not present. Vag. Bleeding: None.  Movement: Present. Denies leaking of fluid.   The following portions of the patient's history were reviewed and updated as appropriate: allergies, current medications, past family history, past medical history, past social history, past surgical history and problem list. Problem list updated.  Objective:   Vitals:   08/20/16 1000  BP: 116/69  Pulse: 90  Weight: 184 lb 8 oz (83.7 kg)    Fetal Status: Fetal Heart Rate (bpm): NST   Movement: Present     General:  Alert, oriented and cooperative. Patient is in no acute distress.  Skin: Skin is warm and dry. No rash noted.   Cardiovascular: Normal heart rate noted  Respiratory: Normal respiratory effort, no problems with respiration noted  Abdomen: Soft, gravid, appropriate for gestational age.  Pain/Pressure: Present     Pelvic: Cervical exam deferred        Extremities: Normal range of motion.  Edema: None  Mental Status:  Normal mood and affect. Normal behavior. Normal judgment and thought content.   Assessment and Plan:  Pregnancy: G2P1001 at [redacted]w[redacted]d  1. GDM, class A2 NST performed today was reviewed and was found to be reactive.  Continue recommended antenatal testing and prenatal care. Stable CBGs on Glyburide. Growth scan today. IOL at 39 weeks. - Fetal nonstress test - [place] MFM FETAL BPP WO NON STRESS; Future  2. History of  genital HSV-1 infection Suppression therapy prescribed. - valACYclovir (VALTREX) 500 MG tablet; Take 1 tablet (500 mg total) by mouth 2 (two) times daily.  Dispense: 60 tablet; Refill: 6  3. Supervision of high risk pregnancy in third trimester Preterm labor symptoms and general obstetric precautions including but not limited to vaginal bleeding, contractions, leaking of fluid and fetal movement were reviewed in detail with the patient. Please refer to After Visit Summary for other counseling recommendations.  Return in about 1 week (around 08/27/2016) for weekly NST/BPP, HOB, pelvic cultures next week as scheduled.   Verita Schneiders, MD

## 2016-08-20 NOTE — Progress Notes (Signed)
Pt reports having low back pain @ night.  [place] for growth and BPP today @ [facility].

## 2016-08-27 ENCOUNTER — Ambulatory Visit (INDEPENDENT_AMBULATORY_CARE_PROVIDER_SITE_OTHER): Payer: Medicaid Other | Admitting: Obstetrics and Gynecology

## 2016-08-27 ENCOUNTER — Ambulatory Visit (INDEPENDENT_AMBULATORY_CARE_PROVIDER_SITE_OTHER): Payer: Medicaid Other | Admitting: *Deleted

## 2016-08-27 ENCOUNTER — Other Ambulatory Visit (HOSPITAL_COMMUNITY)
Admission: RE | Admit: 2016-08-27 | Discharge: 2016-08-27 | Disposition: A | Discharge status: Home / Self Care | Payer: Medicaid Other | Source: Ambulatory Visit | Attending: Obstetrics and Gynecology | Admitting: Obstetrics and Gynecology

## 2016-08-27 ENCOUNTER — Ambulatory Visit: Payer: Self-pay

## 2016-08-27 VITALS — BP 123/79 | HR 80 | Wt 185.8 lb

## 2016-08-27 DIAGNOSIS — O24419 Gestational diabetes mellitus in pregnancy, unspecified control: Secondary | ICD-10-CM

## 2016-08-27 DIAGNOSIS — O0993 Supervision of high risk pregnancy, unspecified, third trimester: Secondary | ICD-10-CM | POA: Insufficient documentation

## 2016-08-27 DIAGNOSIS — Z113 Encounter for screening for infections with a predominantly sexual mode of transmission: Secondary | ICD-10-CM | POA: Diagnosis not present

## 2016-08-27 DIAGNOSIS — Z3A36 36 weeks gestation of pregnancy: Secondary | ICD-10-CM | POA: Insufficient documentation

## 2016-08-27 LAB — POCT URINALYSIS DIP (DEVICE)
Bilirubin Urine: NEGATIVE
Glucose, UA: NEGATIVE mg/dL
Hgb urine dipstick: NEGATIVE
Nitrite: NEGATIVE
PROTEIN: NEGATIVE mg/dL
SPECIFIC GRAVITY, URINE: 1.02 (ref 1.005–1.030)
UROBILINOGEN UA: 0.2 mg/dL (ref 0.0–1.0)
pH: 6.5 (ref 5.0–8.0)

## 2016-08-27 NOTE — Patient Instructions (Signed)
Parto vaginal (Vaginal Delivery) Durante el parto, el mdico la ayudar a dar a luz a su beb. En elparto vaginal, deber pujar para que el beb salga por la vagina. Sin embargo, antes de que pueda sacar al beb, es necesario que ocurran ciertas cosas. La abertura del tero (cuello del tero) tiene que ablandarse, hacerse ms delgado y abrirse (dilatar) hasta que llegue a 10 cm. Adems, el beb tiene que bajar desde el tero a la vagina. SIGNOS DE TRABAJO DE PARTO  El mdico tendr primero que asegurarse de que usted est en trabajo de parto. Algunos signos son:   Eliminar lo que se llama tapn mucoso antes del inicio del trabajo de parto. Este es una pequea cantidad de mucosidad teida con sangre.  Tener contracciones uterinas regulares y dolorosas.   El tiempo entre las contracciones debe acortarse  Las molestias y el dolor se harn ms intensos gradualmente.  El dolor de las contracciones empeora al caminar y no se alivia con el reposo.   El cuello del tero se hace mas delgado (se borra) y se dilata. ANTES DEL PARTO Una vez que se inicie el trabajo de parto y sea admitida en el hospital o sanatorio, el mdico podr hacer lo siguiente:   Realizar un examen fsico.  Controlar si hay complicaciones relacionadas con el trabajo de parto.  Verificar su presin arterial, temperatura y pulso y la frecuencia cardaca (signos vitales).   Determinar si se ha roto el saco amnitico y cundo ha ocurrido.  Realizar un examen vaginal (utilizando un guante estril y un lubricante) para determinar: ? La posicin (presentacin) del beb. El beb se presenta con la cabeza primero (vertex) en el canal de parto (vagina), o estn los pies o las nalgas primero (de nalgas)? ? El nivel (estacin) de la cabeza del beb dentro del canal de parto. ? El borramiento y la dilatacin del cuello uterino  El monitor fetal electrnico generalmente se coloca sobre el abdomen al llegar. Se utiliza para  controlar las contracciones y la frecuencia cardaca del beb. ? Cuando el monitor est en el abdomen (monitor fetal externo), slo toma la frecuencia y la duracin de las contracciones. No informa acerca de la intensidad de las contracciones. ? Si el mdico necesita saber exactamente la intensidad de las contracciones o cul es la frecuencia cardaca del beb, colocar un monitor interno en la vagina y el tero. El mdico comentar los riesgos y los beneficios de usar un monitor interno y le pedir autorizacin antes de colocar el dispositivo. ? El monitoreo fetal continuo ser necesario si le han aplicado una epidural, si le administran ciertos medicamentos (como oxitocina) y si tiene complicaciones del embarazo o del trabajo de parto.  Podrn colocarle una va intravenosa en una vena del brazo para suministrarle lquidos y medicamentos, si es necesario. TRES ETAPAS DEL TRABAJO DE PARTO Y EL PARTO El trabajo de parto y el parto normales se dividen en tres etapas. Primera etapa Esta etapa comienza cuando comienzan las contracciones regulares y el cuello comienza a borrarse y dilatarse. Finaliza cuando el cuello est completamente abierto (completamente dilatado). La primera etapa es la etapa ms larga del trabajo de parto y puede durar desde 3 horas a 15 horas.  Algunos mtodos estn disponibles para ayudar con el dolor del parto. Usted y su mdico decidirn qu opcin es la mejor para usted. Las opciones incluyen:   Medicamentos narcticos. Estos son medicamentos fuertes que usted puede recibir a travs de una va intravenosa o   como inyeccin en el msculo. Estos medicamentos alivian el dolor pero no hacen que desaparezca completamente.  Epidural. Se administra un medicamento a travs de un tubo delgado que se inserta en la espalda. El medicamento adormece la parte inferior del cuerpo y evita el dolor en esa zona.  Bloqueo paracervical Es una inyeccin de un anestsico en cada lado del cuello  uterino.  Usted podr pedir un parto natural, que implica que no se usen analgsicos ni epidural durante el parto y el trabajo de parto. En cambio, podr tener otro tipo de ayuda como ejercicios respiratorios para hacer frente al dolor. Segunda etapa La segunda etapa del trabajo de parto comienza cuando el cuello se ha dilatado completamente a 10 cm. Contina hasta que usted puja al beb hacia abajo, por el canal de parto, y el beb nace. Esta etapa puede durar slo algunos minutos o algunas horas.  La posicin del la cabeza del beb a medida que pasa por el canal de parto, es informada como un nmero, llamado estacin. Si la cabeza del beb no ha iniciado su descenso, la estacin se describe como que est en menos 3 (-3). Cuando la cabeza del beb est en la estacin cero, est en el medio del canal de parto y se encaja en la pelvis. La estacin en la que se encuentra el beb indica el progreso de la segunda etapa del trabajo de parto.  Cuando el beb nace, el mdico lo sostendr con la cabeza hacia abajo para evitar que el lquido amnitico, el moco y la sangre entren en los pulmones del beb. La boca y la nariz del beb podrn ser succionadas con un pequeo bulbo para retirar todo lquido adicional.  El mdico podr colocar al beb sobre su estmago. Es importante evitar que el beb tome fro. Para hacerlo, el mdico secar al beb, lo colocar directamente sobre su piel, (sin mantas entre usted y el beb) y lo cubrir con mantas secas y tibias.  Se corta el cordn umbilical. Tercera etapa Durante la tercera etapa del trabajo de parto, el mdico sacar la placenta (alumbramiento) y se asegurar de que el sangrado est controlado. La salida de la placenta generalmente demora 5 minutos pero puede tardar hasta 30 minutos. Luego de la salida de la placenta, le darn un medicamento por va intravenosa o inyectable para ayudar a contraer el tero y controlar el sangrado. Si planea amamantar al beb,  puede intentar en este momento Luego de la salida de la placenta, el tero debe contraerse y quedar muy firme. Si el tero no queda firme, el mdico lo masajear. Esto es importante debido a que la contraccin del tero ayuda a cortar el sangrado en el sitio en que la placenta estaba unida al tero. Si el tero no se contrae adecuadamente ni permanece firme, podr causar un sangrado abundante. Si hay mucho sangrado, podrn darle medicamentos para contraer el tero y detener el sangrado.  Esta informacin no tiene como fin reemplazar el consejo del mdico. Asegrese de hacerle al mdico cualquier pregunta que tenga. Document Released: 12/25/2007 Document Revised: 02/01/2014 Elsevier Interactive Patient Education  2017 [employer]vier Inc.  

## 2016-08-27 NOTE — Progress Notes (Signed)

## 2016-08-27 NOTE — Progress Notes (Signed)
[place] for growth scheduled on 8/17.

## 2016-08-27 NOTE — Progress Notes (Signed)
Subjective:  Catherine Houston is a 36 y.o. G2P1001 at [redacted]w[redacted]d being seen today for ongoing prenatal care.  She is currently monitored for the following issues for this high-risk pregnancy and has GDM, class A2; Supervision of high-risk pregnancy; Hereditary disease in family possibly affecting fetus, affecting management of mother, antepartum condition or complication, not applicable or unspecified fetus; History of genital HSV-1 infection; BMI 31.0-31.9,adult; and Obesity in pregnancy on her problem list.  Patient reports no complaints.  Contractions: Not present. Vag. Bleeding: None.  Movement: Present. Denies leaking of fluid.   The following portions of the patient's history were reviewed and updated as appropriate: allergies, current medications, past family history, past medical history, past social history, past surgical history and problem list. Problem list updated.  Objective:   Vitals:   08/27/16 1057  BP: 123/79  Pulse: 80  Weight: 185 lb 12.8 oz (84.3 kg)    Fetal Status: Fetal Heart Rate (bpm): NST   Movement: Present     General:  Alert, oriented and cooperative. Patient is in no acute distress.  Skin: Skin is warm and dry. No rash noted.   Cardiovascular: Normal heart rate noted  Respiratory: Normal respiratory effort, no problems with respiration noted  Abdomen: Soft, gravid, appropriate for gestational age. Pain/Pressure: Present     Pelvic:  Cervical exam performed        Extremities: Normal range of motion.  Edema: None  Mental Status: Normal mood and affect. Normal behavior. Normal judgment and thought content.   Urinalysis:      Assessment and Plan:  Pregnancy: G2P1001 at [redacted]w[redacted]d  1. Supervision of high risk pregnancy in third trimester Stable - GC/Chlamydia probe amp ([place])not at [facility] - Strep Gp B NAA  2. GDM, class A2 BS in goal range Continue with current management 10/10 BPP today Continue with antenatal testing IOL at 39 weeks, schedule at next OB  visit  Preterm labor symptoms and general obstetric precautions including but not limited to vaginal bleeding, contractions, leaking of fluid and fetal movement were reviewed in detail with the patient. Please refer to After Visit Summary for other counseling recommendations.  Return in about 2 weeks (around 09/10/2016) for NST and Ob fu - has [place] @ 0930; keep 8/9 as scheduled.   Chancy Milroy, MD

## 2016-08-29 LAB — STREP GP B NAA: STREP GROUP B AG: NEGATIVE

## 2016-08-30 LAB — GC/CHLAMYDIA PROBE AMP (~~LOC~~) NOT AT ARMC
CHLAMYDIA, DNA PROBE: NEGATIVE
NEISSERIA GONORRHEA: NEGATIVE

## 2016-09-02 ENCOUNTER — Ambulatory Visit (INDEPENDENT_AMBULATORY_CARE_PROVIDER_SITE_OTHER): Payer: Medicaid Other | Admitting: *Deleted

## 2016-09-02 ENCOUNTER — Ambulatory Visit (INDEPENDENT_AMBULATORY_CARE_PROVIDER_SITE_OTHER): Payer: Medicaid Other | Admitting: Obstetrics and Gynecology

## 2016-09-02 ENCOUNTER — Telehealth (HOSPITAL_COMMUNITY): Payer: Self-pay | Admitting: *Deleted

## 2016-09-02 ENCOUNTER — Ambulatory Visit: Payer: Self-pay

## 2016-09-02 ENCOUNTER — Encounter (HOSPITAL_COMMUNITY): Payer: Self-pay | Admitting: *Deleted

## 2016-09-02 VITALS — BP 121/72 | HR 79 | Wt 186.5 lb

## 2016-09-02 DIAGNOSIS — B009 Herpesviral infection, unspecified: Secondary | ICD-10-CM

## 2016-09-02 DIAGNOSIS — O352XX0 Maternal care for (suspected) hereditary disease in fetus, not applicable or unspecified: Secondary | ICD-10-CM

## 2016-09-02 DIAGNOSIS — O24419 Gestational diabetes mellitus in pregnancy, unspecified control: Secondary | ICD-10-CM

## 2016-09-02 DIAGNOSIS — O99213 Obesity complicating pregnancy, third trimester: Secondary | ICD-10-CM

## 2016-09-02 DIAGNOSIS — O0993 Supervision of high risk pregnancy, unspecified, third trimester: Secondary | ICD-10-CM

## 2016-09-02 DIAGNOSIS — O99210 Obesity complicating pregnancy, unspecified trimester: Secondary | ICD-10-CM

## 2016-09-02 LAB — POCT URINALYSIS DIP (DEVICE)
Bilirubin Urine: NEGATIVE
GLUCOSE, UA: NEGATIVE mg/dL
Hgb urine dipstick: NEGATIVE
Ketones, ur: NEGATIVE mg/dL
Nitrite: NEGATIVE
PROTEIN: NEGATIVE mg/dL
Specific Gravity, Urine: 1.02 (ref 1.005–1.030)
Urobilinogen, UA: 0.2 mg/dL (ref 0.0–1.0)
pH: 6 (ref 5.0–8.0)

## 2016-09-02 NOTE — Telephone Encounter (Signed)
Preadmission screen  

## 2016-09-02 NOTE — Progress Notes (Signed)
Pt here for NST/BPP and Ob fu.  IOL scheduled 8/20 @ 0700.   Pt informed that the ultrasound is considered a limited OB ultrasound and is not intended to be a complete ultrasound exam.  Patient also informed that the ultrasound is not being completed with the intent of assessing for fetal or placental anomalies or any pelvic abnormalities.  Explained that the purpose of today's ultrasound is to assess for presentation, BPP and amniotic fluid volume.  Patient acknowledges the purpose of the exam and the limitations of the study.

## 2016-09-02 NOTE — Progress Notes (Signed)
   PRENATAL VISIT NOTE  Subjective:  Catherine Houston is a 36 y.o. G2P1001 at [redacted]w[redacted]d being seen today for ongoing prenatal care.  She is currently monitored for the following issues for this high-risk pregnancy and has GDM, class A2; Supervision of high-risk pregnancy; Hereditary disease in family possibly affecting fetus, affecting management of mother, antepartum condition or complication, not applicable or unspecified fetus; History of genital HSV-1 infection; BMI 31.0-31.9,adult; and Obesity in pregnancy on her problem list.  Patient reports no complaints.   .  .   . Denies leaking of fluid.   The following portions of the patient's history were reviewed and updated as appropriate: allergies, current medications, past family history, past medical history, past social history, past surgical history and problem list. Problem list updated.  Objective:   Vitals:   09/02/16 1007  Weight: 186 lb 8 oz (84.6 kg)    Fetal Status:           General:  Alert, oriented and cooperative. Patient is in no acute distress.  Skin: Skin is warm and dry. No rash noted.   Cardiovascular: Normal heart rate noted  Respiratory: Normal respiratory effort, no problems with respiration noted  Abdomen: Soft, gravid, appropriate for gestational age.        Pelvic: Cervical exam deferred        Extremities: Normal range of motion.     Mental Status:  Normal mood and affect. Normal behavior. Normal judgment and thought content.   Assessment and Plan:  Pregnancy: G2P1001 at [redacted]w[redacted]d  1. Supervision of high risk pregnancy in third trimester Patient is doing well without complaints  2. GDM, class A2 CBGs reviewed and within range Follow up growth ultrasound on 8/17 NST reviewed and reactive BPP 10/10  3. Obesity in pregnancy Appropriate weight gain during the pregnancy  4. Hereditary disease in family possibly affecting fetus, affecting management of mother, antepartum condition or complication, not applicable or  unspecified fetus Low risk NIPS  5. History of genital HSV-1 infection Continue valacyclovir  Term labor symptoms and general obstetric precautions including but not limited to vaginal bleeding, contractions, leaking of fluid and fetal movement were reviewed in detail with the patient. Please refer to After Visit Summary for other counseling recommendations.  No Follow-up on file.   Mora Bellman, MD

## 2016-09-08 ENCOUNTER — Other Ambulatory Visit: Payer: Self-pay | Admitting: Advanced Practice Midwife

## 2016-09-10 ENCOUNTER — Ambulatory Visit (INDEPENDENT_AMBULATORY_CARE_PROVIDER_SITE_OTHER): Payer: Medicaid Other | Admitting: *Deleted

## 2016-09-10 ENCOUNTER — Ambulatory Visit (INDEPENDENT_AMBULATORY_CARE_PROVIDER_SITE_OTHER): Payer: Medicaid Other | Admitting: Obstetrics and Gynecology

## 2016-09-10 ENCOUNTER — Other Ambulatory Visit (HOSPITAL_COMMUNITY): Payer: Self-pay | Admitting: Maternal and Fetal Medicine

## 2016-09-10 ENCOUNTER — Ambulatory Visit (HOSPITAL_COMMUNITY)
Admission: RE | Admit: 2016-09-10 | Discharge: 2016-09-10 | Disposition: A | Discharge status: Home / Self Care | Payer: Medicaid Other | Source: Ambulatory Visit | Attending: Obstetrics and Gynecology | Admitting: Obstetrics and Gynecology

## 2016-09-10 ENCOUNTER — Encounter (HOSPITAL_COMMUNITY): Payer: Self-pay

## 2016-09-10 VITALS — BP 117/71 | HR 81 | Wt 188.2 lb

## 2016-09-10 DIAGNOSIS — Z362 Encounter for other antenatal screening follow-up: Secondary | ICD-10-CM

## 2016-09-10 DIAGNOSIS — O24419 Gestational diabetes mellitus in pregnancy, unspecified control: Secondary | ICD-10-CM | POA: Diagnosis present

## 2016-09-10 DIAGNOSIS — O24415 Gestational diabetes mellitus in pregnancy, controlled by oral hypoglycemic drugs: Secondary | ICD-10-CM | POA: Diagnosis present

## 2016-09-10 DIAGNOSIS — E669 Obesity, unspecified: Secondary | ICD-10-CM | POA: Insufficient documentation

## 2016-09-10 DIAGNOSIS — Z6831 Body mass index (BMI) 31.0-31.9, adult: Secondary | ICD-10-CM | POA: Diagnosis not present

## 2016-09-10 DIAGNOSIS — B009 Herpesviral infection, unspecified: Secondary | ICD-10-CM

## 2016-09-10 DIAGNOSIS — Z3A38 38 weeks gestation of pregnancy: Secondary | ICD-10-CM | POA: Diagnosis not present

## 2016-09-10 DIAGNOSIS — O0993 Supervision of high risk pregnancy, unspecified, third trimester: Secondary | ICD-10-CM

## 2016-09-10 DIAGNOSIS — O99210 Obesity complicating pregnancy, unspecified trimester: Secondary | ICD-10-CM

## 2016-09-10 DIAGNOSIS — O99213 Obesity complicating pregnancy, third trimester: Secondary | ICD-10-CM | POA: Diagnosis not present

## 2016-09-10 DIAGNOSIS — O352XX0 Maternal care for (suspected) hereditary disease in fetus, not applicable or unspecified: Secondary | ICD-10-CM

## 2016-09-10 LAB — POCT URINALYSIS DIP (DEVICE)
Bilirubin Urine: NEGATIVE
GLUCOSE, UA: NEGATIVE mg/dL
Hgb urine dipstick: NEGATIVE
KETONES UR: NEGATIVE mg/dL
Nitrite: NEGATIVE
Protein, ur: NEGATIVE mg/dL
SPECIFIC GRAVITY, URINE: 1.02 (ref 1.005–1.030)
Urobilinogen, UA: 0.2 mg/dL (ref 0.0–1.0)
pH: 6.5 (ref 5.0–8.0)

## 2016-09-10 NOTE — Progress Notes (Signed)
   PRENATAL VISIT NOTE  Subjective:  Catherine Houston is a 36 y.o. G2P1001 at [redacted]w[redacted]d being seen today for ongoing prenatal care.  She is currently monitored for the following issues for this high-risk pregnancy and has GDM, class A2; Supervision of high-risk pregnancy; Hereditary disease in family possibly affecting fetus, affecting management of mother, antepartum condition or complication, not applicable or unspecified fetus; History of genital HSV-1 infection; BMI 31.0-31.9,adult; and Obesity in pregnancy on her problem list.  Patient reports no complaints.  Contractions: Irregular. Vag. Bleeding: None.  Movement: Present. Denies leaking of fluid.   The following portions of the patient's history were reviewed and updated as appropriate: allergies, current medications, past family history, past medical history, past social history, past surgical history and problem list. Problem list updated.  Objective:   Vitals:   09/10/16 0816  BP: 117/71  Pulse: 81  Weight: 188 lb 3.2 oz (85.4 kg)    Fetal Status: Fetal Heart Rate (bpm): NST   Movement: Present     General:  Alert, oriented and cooperative. Patient is in no acute distress.  Skin: Skin is warm and dry. No rash noted.   Cardiovascular: Normal heart rate noted  Respiratory: Normal respiratory effort, no problems with respiration noted  Abdomen: Soft, gravid, appropriate for gestational age.  Pain/Pressure: Present     Pelvic: Cervical exam deferred        Extremities: Normal range of motion.  Edema: None  Mental Status:  Normal mood and affect. Normal behavior. Normal judgment and thought content.   Assessment and Plan:  Pregnancy: G2P1001 at [redacted]w[redacted]d  1. Supervision of high risk pregnancy in third trimester Patient is doing well without complaints Scheduled for IOL on 8/20  2. GDM, class A2 CBGs reviewed and great majority within range. 2 pp as high as 150 due to consuming pizza Growth ultrasound today NST reviewed and  reactive  3. Obesity in pregnancy Appropriate weight gain during the pregnancy  4. Hereditary disease in family possibly affecting fetus, affecting management of mother, antepartum condition or complication, not applicable or unspecified fetus Low risk NIPS  5. History of genital HSV-1 infection Continue Valtrex  Term labor symptoms and general obstetric precautions including but not limited to vaginal bleeding, contractions, leaking of fluid and fetal movement were reviewed in detail with the patient. Please refer to After Visit Summary for other counseling recommendations.  Return in about 6 weeks (around 10/22/2016) for PP visit.  IOL on 8/20.   Mora Bellman, MD

## 2016-09-10 NOTE — Progress Notes (Signed)
[place] for growth today @ 0930.  IOL scheduled 8/20 @ 0700.

## 2016-09-13 ENCOUNTER — Inpatient Hospital Stay (HOSPITAL_COMMUNITY): Payer: Medicaid Other | Admitting: Anesthesiology

## 2016-09-13 ENCOUNTER — Inpatient Hospital Stay (HOSPITAL_COMMUNITY)
Admission: RE | Admit: 2016-09-13 | Discharge: 2016-09-15 | DRG: 774 | Disposition: A | Discharge status: Home / Self Care | Payer: Medicaid Other | Source: Ambulatory Visit | Attending: Obstetrics and Gynecology | Admitting: Obstetrics and Gynecology

## 2016-09-13 ENCOUNTER — Encounter (HOSPITAL_COMMUNITY): Payer: Self-pay

## 2016-09-13 DIAGNOSIS — O24425 Gestational diabetes mellitus in childbirth, controlled by oral hypoglycemic drugs: Secondary | ICD-10-CM | POA: Diagnosis present

## 2016-09-13 DIAGNOSIS — Z7982 Long term (current) use of aspirin: Secondary | ICD-10-CM

## 2016-09-13 DIAGNOSIS — E669 Obesity, unspecified: Secondary | ICD-10-CM | POA: Diagnosis present

## 2016-09-13 DIAGNOSIS — O99214 Obesity complicating childbirth: Secondary | ICD-10-CM | POA: Diagnosis present

## 2016-09-13 DIAGNOSIS — Z6831 Body mass index (BMI) 31.0-31.9, adult: Secondary | ICD-10-CM | POA: Diagnosis not present

## 2016-09-13 DIAGNOSIS — A6000 Herpesviral infection of urogenital system, unspecified: Secondary | ICD-10-CM | POA: Diagnosis present

## 2016-09-13 DIAGNOSIS — Z3A39 39 weeks gestation of pregnancy: Secondary | ICD-10-CM | POA: Diagnosis not present

## 2016-09-13 DIAGNOSIS — O24419 Gestational diabetes mellitus in pregnancy, unspecified control: Secondary | ICD-10-CM | POA: Diagnosis present

## 2016-09-13 DIAGNOSIS — O9832 Other infections with a predominantly sexual mode of transmission complicating childbirth: Secondary | ICD-10-CM | POA: Diagnosis present

## 2016-09-13 HISTORY — DX: Gestational diabetes mellitus in pregnancy, unspecified control: O24.419

## 2016-09-13 LAB — CBC
HCT: 36.5 % (ref 36.0–46.0)
Hemoglobin: 12.5 g/dL (ref 12.0–15.0)
MCH: 28.9 pg (ref 26.0–34.0)
MCHC: 34.2 g/dL (ref 30.0–36.0)
MCV: 84.5 fL (ref 78.0–100.0)
PLATELETS: 265 10*3/uL (ref 150–400)
RBC: 4.32 MIL/uL (ref 3.87–5.11)
RDW: 14.8 % (ref 11.5–15.5)
WBC: 9.6 10*3/uL (ref 4.0–10.5)

## 2016-09-13 LAB — TYPE AND SCREEN
ABO/RH(D): AB POS
ANTIBODY SCREEN: NEGATIVE

## 2016-09-13 LAB — RPR: RPR Ser Ql: NONREACTIVE

## 2016-09-13 LAB — GLUCOSE, CAPILLARY
GLUCOSE-CAPILLARY: 82 mg/dL (ref 65–99)
Glucose-Capillary: 97 mg/dL (ref 65–99)
Glucose-Capillary: 89 mg/dL (ref 65–99)
Glucose-Capillary: 72 mg/dL (ref 65–99)

## 2016-09-13 LAB — ABO/RH: ABO/RH(D): AB POS

## 2016-09-13 MED ORDER — DIPHENHYDRAMINE HCL 50 MG/ML IJ SOLN: 12.5000 mg | INTRAMUSCULAR | Status: DC | PRN

## 2016-09-13 MED ORDER — ONDANSETRON HCL 4 MG/2ML IJ SOLN: 4.0000 mg | Freq: Four times a day (QID) | INTRAMUSCULAR | Status: DC | PRN

## 2016-09-13 MED ORDER — PHENYLEPHRINE 40 MCG/ML (10ML) SYRINGE FOR IV PUSH (FOR BLOOD PRESSURE SUPPORT)
80.0000 ug | PREFILLED_SYRINGE | INTRAVENOUS | Status: DC | PRN
  Filled 2016-09-13: qty 5

## 2016-09-13 MED ORDER — PHENYLEPHRINE 40 MCG/ML (10ML) SYRINGE FOR IV PUSH (FOR BLOOD PRESSURE SUPPORT)
80.0000 ug | PREFILLED_SYRINGE | INTRAVENOUS | Status: DC | PRN
Start: 2016-09-13 — End: 2016-09-13

## 2016-09-13 MED ORDER — OXYTOCIN BOLUS FROM INFUSION
500.0000 mL | Freq: Once | INTRAVENOUS | Status: AC
  Administered 2016-09-14: 500 mL via INTRAVENOUS

## 2016-09-13 MED ORDER — FENTANYL CITRATE (PF) 100 MCG/2ML IJ SOLN: 100.0000 ug | INTRAMUSCULAR | Status: DC | PRN

## 2016-09-13 MED ORDER — LIDOCAINE HCL (PF) 1 % IJ SOLN
INTRAMUSCULAR | Status: DC | PRN
  Administered 2016-09-13 (×2): 5 mL via EPIDURAL

## 2016-09-13 MED ORDER — ACETAMINOPHEN 325 MG PO TABS: 650.0000 mg | ORAL_TABLET | ORAL | Status: DC | PRN

## 2016-09-13 MED ORDER — LACTATED RINGERS IV SOLN: 500.0000 mL | Freq: Once | INTRAVENOUS | Status: DC

## 2016-09-13 MED ORDER — PHENYLEPHRINE 40 MCG/ML (10ML) SYRINGE FOR IV PUSH (FOR BLOOD PRESSURE SUPPORT): 80.0000 ug | PREFILLED_SYRINGE | INTRAVENOUS | Status: DC | PRN

## 2016-09-13 MED ORDER — EPHEDRINE 5 MG/ML INJ
10.0000 mg | INTRAVENOUS | Status: DC | PRN
  Filled 2016-09-13: qty 2

## 2016-09-13 MED ORDER — OXYCODONE-ACETAMINOPHEN 5-325 MG PO TABS: 2.0000 | ORAL_TABLET | ORAL | Status: DC | PRN

## 2016-09-13 MED ORDER — PHENYLEPHRINE 40 MCG/ML (10ML) SYRINGE FOR IV PUSH (FOR BLOOD PRESSURE SUPPORT)
80.0000 ug | PREFILLED_SYRINGE | INTRAVENOUS | Status: DC | PRN
  Filled 2016-09-13: qty 5
  Filled 2016-09-13: qty 10

## 2016-09-13 MED ORDER — OXYTOCIN 40 UNITS IN LACTATED RINGERS INFUSION - SIMPLE MED
1.0000 m[IU]/min | INTRAVENOUS | Status: DC
  Administered 2016-09-13: 2 m[IU]/min via INTRAVENOUS

## 2016-09-13 MED ORDER — LACTATED RINGERS IV SOLN
INTRAVENOUS | Status: DC
  Administered 2016-09-13 – 2016-09-14 (×3): via INTRAVENOUS

## 2016-09-13 MED ORDER — EPHEDRINE 5 MG/ML INJ: 10.0000 mg | INTRAVENOUS | Status: DC | PRN

## 2016-09-13 MED ORDER — FLEET ENEMA 7-19 GM/118ML RE ENEM: 1.0000 | ENEMA | RECTAL | Status: DC | PRN

## 2016-09-13 MED ORDER — MISOPROSTOL 25 MCG QUARTER TABLET
25.0000 ug | ORAL_TABLET | ORAL | Status: DC | PRN
  Administered 2016-09-13: 25 ug via VAGINAL
  Filled 2016-09-13: qty 1

## 2016-09-13 MED ORDER — MISOPROSTOL 50MCG HALF TABLET
50.0000 ug | ORAL_TABLET | ORAL | Status: DC | PRN
  Filled 2016-09-13: qty 1

## 2016-09-13 MED ORDER — TERBUTALINE SULFATE 1 MG/ML IJ SOLN
0.2500 mg | Freq: Once | INTRAMUSCULAR | Status: DC | PRN
Start: 2016-09-13 — End: 2016-09-14
  Filled 2016-09-13: qty 1

## 2016-09-13 MED ORDER — OXYCODONE-ACETAMINOPHEN 5-325 MG PO TABS: 1.0000 | ORAL_TABLET | ORAL | Status: DC | PRN

## 2016-09-13 MED ORDER — OXYTOCIN 40 UNITS IN LACTATED RINGERS INFUSION - SIMPLE MED
2.5000 [IU]/h | INTRAVENOUS | Status: DC
  Filled 2016-09-13: qty 1000

## 2016-09-13 MED ORDER — FENTANYL 2.5 MCG/ML BUPIVACAINE 1/10 % EPIDURAL INFUSION (WH - ANES)
14.0000 mL/h | INTRAMUSCULAR | Status: DC | PRN
  Administered 2016-09-13 – 2016-09-14 (×2): 14 mL/h via EPIDURAL
  Filled 2016-09-13 (×2): qty 100

## 2016-09-13 MED ORDER — LIDOCAINE HCL (PF) 1 % IJ SOLN
30.0000 mL | INTRAMUSCULAR | Status: DC | PRN
  Filled 2016-09-13: qty 30

## 2016-09-13 MED ORDER — TERBUTALINE SULFATE 1 MG/ML IJ SOLN
0.2500 mg | Freq: Once | INTRAMUSCULAR | Status: DC | PRN
  Filled 2016-09-13: qty 1

## 2016-09-13 MED ORDER — SOD CITRATE-CITRIC ACID 500-334 MG/5ML PO SOLN: 30.0000 mL | ORAL | Status: DC | PRN

## 2016-09-13 MED ORDER — LACTATED RINGERS IV SOLN: 500.0000 mL | INTRAVENOUS | Status: DC | PRN

## 2016-09-13 NOTE — Anesthesia Preprocedure Evaluation (Signed)
Anesthesia Evaluation  Patient identified by MRN, date of birth, ID band Patient awake    Reviewed: Allergy & Precautions, H&P , NPO status , Patient's Chart, lab work & pertinent test results  Airway Mallampati: II   Neck ROM: full    Dental   Pulmonary    breath sounds clear to auscultation       Cardiovascular negative cardio ROS   Rhythm:regular Rate:Normal     Neuro/Psych    GI/Hepatic   Endo/Other  diabetes, Gestational  Renal/GU      Musculoskeletal   Abdominal   Peds  Hematology   Anesthesia Other Findings   Reproductive/Obstetrics (+) Pregnancy                             Anesthesia Physical Anesthesia Plan  ASA: II  Anesthesia Plan: Epidural   Post-op Pain Management:    Induction: Intravenous  PONV Risk Score and Plan: 2 and Treatment may vary due to age or medical condition  Airway Management Planned: Natural Airway  Additional Equipment:   Intra-op Plan:   Post-operative Plan:   Informed Consent: I have reviewed the patients History and Physical, chart, labs and discussed the procedure including the risks, benefits and alternatives for the proposed anesthesia with the patient or authorized representative who has indicated his/her understanding and acceptance.     Plan Discussed with: Anesthesiologist  Anesthesia Plan Comments:         Anesthesia Quick Evaluation

## 2016-09-13 NOTE — Progress Notes (Signed)
Labor Progress Note  Catherine Houston is a 35 y.o. female G2P1001 with IUP at [redacted]w[redacted]d by LMP presenting for IOL for A2GDM controlled with glyburide 2.5mg  BID.  S: Patient seen & examined for progress of labor. Patient is doing well.  O: BP 120/77   Pulse 70   Temp 97.9 F (36.6 C)   Resp 20   Ht 5\' 2"  (1.575 m)   Wt 85.3 kg (188 lb)   LMP 12/15/2015   BMI 34.39 kg/m   FHT: 140bpm, mod var, +accels, no decels TOCO: q2-68min, patient looks comfortable during contractions  CVE: Dilation: Fingertip Effacement (%): Thick Cervical Position: Posterior Station: -3 Presentation: Vertex Exam by:: Dr. Gerarda Fraction   A&P: 36 y.o. G2P1001 [redacted]w[redacted]d here for IOL for A2GDM  Labor: Last dose of cytotec 25 mcg @ 8:23. Foley bulb placed at 12:24pm Fetal Wellbeing:  Category I Pain Control:  Labor support without medications. Medications available per request Anticipated MOD:  NSVD  A2GDM: post prandial glucose 89. Continue carb modified diet, hold glyburide for now.  Continue expectant management   [place] Student 09/13/2016 1:05 PM

## 2016-09-13 NOTE — Progress Notes (Signed)
Labor Progress Note  Catherine Houston is a 36 y.o. female G2P1001 with IUP at [redacted]w[redacted]d by LMP presenting for IOL for A2GDM controlled with glyburide 2.5mg  BID.  S: Patient seen & examined for progress of labor. Patient is doing well. Foley bulb has come out. Patient says she does not currently need any pain medications but will ask for them when needed.  O: BP 132/82   Pulse 71   Temp 97.9 F (36.6 C) (Oral)   Resp 20   Ht 5\' 2"  (1.575 m)   Wt 85.3 kg (188 lb)   LMP 12/15/2015   BMI 34.39 kg/m   FHT: 140bpm, mod var, +accels, variable decels TOCO: q2-77min, patient looks comfortable during contractions  CVE: Dilation: 4 Effacement (%): 40 Cervical Position: Posterior Station: -3 Presentation: Vertex Exam by:: Ruben Im RNC   A&P: 36 y.o. G2P1001 [redacted]w[redacted]d here for IOL for A2GDM  Labor: Only received one dose of cytotec this morning, foley bulb fell out around 5:30pm, dialated to 4cm, will start 2x2 pitocin Fetal Wellbeing:  Category 2 Pain Control:  Labor support without medications. Medications available per request Anticipated MOD:  NSVD  A2GDM: most recent glucose 82, continue to monitor levels q4hrs. Continue carb modified diet, continue to hold glyburide.  Continue expectant management   Reather Converse Medical Student 09/13/2016 6:36 PM

## 2016-09-13 NOTE — Progress Notes (Signed)
Patient ID: Catherine Houston, female   DOB: [birth year], 36 y.o.   MRN: [id number]  Awaiting epidural; began feeling ctx stronger recently  BP 124/79, other VSS FHR 140s, +accels, no decels Ctx q 2 mins w/ Pit @ 64mu/min Cx deferred (was 4/40/-3)  CBG 72  IUP@term  GDMA2 Cx favorable GBS neg  Check cx 1-2 hrs after comfortable w/ epidural Anticipate SVD  Serita Grammes CNM 09/13/2016 10:36 PM

## 2016-09-13 NOTE — Anesthesia Pain Management Evaluation Note (Signed)
  CRNA Pain Management Visit Note  Patient: Catherine Houston, 36 y.o., female  "Hello I am a member of the anesthesia team at [facility]. We have an anesthesia team available at all times to provide care throughout the hospital, including epidural management and anesthesia for C-section. I don't know your plan for the delivery whether it a natural birth, water birth, IV sedation, nitrous supplementation, doula or epidural, but we want to meet your pain goals."   1.Was your pain managed to your expectations on prior hospitalizations?   Yes   2.What is your expectation for pain management during this hospitalization?     Epidural  3.How can we help you reach that goal? Epidural when ready.  Record the patient's initial score and the patient's pain goal.   Pain: 0  Pain Goal: 6 The [facility] wants you to be able to say your pain was always managed very well.  Carren Blakley L 09/13/2016

## 2016-09-13 NOTE — Anesthesia Procedure Notes (Signed)
Epidural Patient location during procedure: OB Start time: 09/13/2016 10:01 PM End time: 09/13/2016 10:09 PM  Staffing Anesthesiologist: Marcie Bal, Tedd Cottrill Performed: anesthesiologist   Preanesthetic Checklist Completed: patient identified, site marked, pre-op evaluation, timeout performed, IV checked, risks and benefits discussed and monitors and equipment checked  Epidural Patient position: sitting Prep: DuraPrep Patient monitoring: heart rate, cardiac monitor, continuous pulse ox and blood pressure Approach: midline Location: L2-L3 Injection technique: LOR saline  Needle:  Needle type: Tuohy  Needle gauge: 17 G Needle length: 9 cm Needle insertion depth: 6 cm Catheter type: closed end flexible Catheter size: 19 Gauge Catheter at skin depth: 12 cm Test dose: negative and Other  Assessment Events: blood not aspirated, injection not painful, no injection resistance and negative IV test  Additional Notes Informed consent obtained prior to proceeding including risk of failure, 1% risk of PDPH, risk of minor discomfort and bruising.  Discussed rare but serious complications including epidural abscess, permanent nerve injury, epidural hematoma.  Discussed alternatives to epidural analgesia and patient desires to proceed.  Timeout performed pre-procedure verifying patient name, procedure, and platelet count.  Patient tolerated procedure well. Reason for block:procedure for pain

## 2016-09-13 NOTE — H&P (Signed)
LABOR ADMISSION HISTORY AND PHYSICAL  Catherine Houston is a 36 y.o. female G2P1001 with IUP at [redacted]w[redacted]d by LMP presenting for IOL for A2GDM on glyburide 2.5mg  (1.5 tab @lunch  and 1 tab @ bedtime). She reports +FMs, No LOF, no VB, no blurry vision, no headaches, no peripheral edema, and no RUQ pain.  She plans on breast feeding. She requests nuvaring for birth control.  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 Genital HSV    HSV1  . Gestational diabetes   . UTI (urinary tract infection)    2015    Past Surgical History: Past Surgical History:  Procedure Laterality Date  . NO PAST SURGERIES      Obstetrical History: OB History    Gravida Para Term Preterm AB Living   2 1 1     1    SAB TAB Ectopic Multiple Live Births                  Obstetric Comments   8lbs 11oz.       Social History: Social History   Social History  . Marital status: Divorced    Spouse name: N/A  . Number of children: N/A  . Years of education: N/A   Social History Main Topics  . Smoking status: Never Smoker  . Smokeless tobacco: Never Used  . Alcohol use No  . Drug use: No  . Sexual activity: Yes    Birth control/ protection: None   Other Topics Concern  . None   Social History Narrative  . None    Family History: Family History  Problem Relation Age of Onset  . Diabetes Mother   . Diabetes Father     Allergies: Not on File  Prescriptions Prior to Admission  Medication Sig Dispense Refill Last Dose  . ACCU-CHEK FASTCLIX LANCETS MISC 1 Device by Percutaneous route 4 (four) times daily. 100 each 12 Taking  . aspirin EC 81 MG tablet  Take 1 tablet (81 mg total) by mouth daily. 60 tablet 6 Taking  . glucose blood (ACCU-CHEK GUIDE) test strip Use as instructed 4 times daily 100 each 12 Taking  . glyBURIDE (DIABETA) 2.5 MG tablet Take 1.5 tablet @ lunch and 1 tablet @ bedtime. 60 tablet 3 Taking  . Prenatal Vit-Fe Fumarate-FA (PRENATAL VITAMIN PO) Take by mouth.   Taking  . valACYclovir (VALTREX) 500 MG tablet Take 1 tablet (500 mg total) by mouth 2 (two) times daily. 60 tablet 6 Taking     Review of Systems   All systems reviewed and negative except as stated in HPI  Blood pressure 117/88, pulse (!) 119, temperature 97.6 F (36.4 C), temperature source Oral, resp. rate 20, height 5\' 2"  (1.575 m), weight 85.3 kg (188 lb), last menstrual period 12/15/2015. General appearance: alert, cooperative and no distress Lungs: normal work of breathing Cardiac: tachycardic, regular rate and rhythym Extremities: Homans sign is negative, no sign of DVT Presentation: cephalic Fetal monitoringBaseline: 140 bpm, Variability: Good {> 6 bpm), Accelerations: Reactive and Decelerations: Absent Uterine activityFrequency: Every 5-7 minutes Dilation: Fingertip Effacement (%): Thick Station: -3 Exam by:: Dr. Gerarda Fraction    Prenatal labs: ABO, Rh: AB/Positive/-- (01/29  0000) Antibody: Negative (01/29 0000) Rubella: immune RPR: Non Reactive (06/07 1457)  HBsAg: Negative (01/29 0000)  HIV: Non-reactive (01/29 0000)  GBS: Negative (08/03 1126)  GTT: GDM; unable to locate test results  Prenatal Transfer Tool  Maternal Diabetes: Yes:  Diabetes Type:  Insulin/Medication controlled Genetic Screening: Normal Maternal Ultrasounds/Referrals: Normal Fetal Ultrasounds or other Referrals:  None Maternal Substance Abuse:  No Significant Maternal Medications:  Meds include: Other: glyburide, Valtex Significant Maternal Lab Results: Lab values include: Group B Strep negative  Results for orders placed or performed during the hospital encounter of  09/13/16 (from the past 24 hour(s))  CBC   Collection Time: 09/13/16  7:52 AM  Result Value Ref Range   WBC 9.6 4.0 - 10.5 K/uL   RBC 4.32 3.87 - 5.11 MIL/uL   Hemoglobin 12.5 12.0 - 15.0 g/dL   HCT 36.5 36.0 - 46.0 %   MCV 84.5 78.0 - 100.0 fL   MCH 28.9 26.0 - 34.0 pg   MCHC 34.2 30.0 - 36.0 g/dL   RDW 14.8 11.5 - 15.5 %   Platelets 265 150 - 400 K/uL  Glucose, capillary   Collection Time: 09/13/16  8:29 AM  Result Value Ref Range   Glucose-Capillary 97 65 - 99 mg/dL    Patient Active Problem List   Diagnosis Date Noted  . Gestational diabetes 09/13/2016  . 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 BMI 31.0-31.9,adult 03/23/2016  . Obesity in pregnancy 03/23/2016  . Hereditary disease in family possibly affecting fetus, affecting management of mother, antepartum condition or complication, not applicable or unspecified fetus   . Supervision of high-risk pregnancy 03/03/2016  . GDM, class A2 03/01/2016    Assessment: Aeriel Houston is a 36 y.o. G2P1001 at [redacted]w[redacted]d here for IOL for A2 GDM.  #Labor:Induction per protocol. Start with cytotec for cervical ripening and place foley bulb when able. #Pain: Epidural upon request #FWB: Cat 1 #ID: GBS negative #MOF: Breast #MOC: Nuvaring #Circ: N/A #A2GDM: will monitor sugars during labor. Fasting glucose: 97. Carb modified diet until pitocin  Idamay Student 09/13/2016, 9:16 AM  OB FELLOW HISTORY AND PHYSICAL ATTESTATION  I confirm that I have verified the information documented in the medical student's note and that I have also personally reperformed the physical exam and all medical decision making activities. I agree with above documentation and have made edits as needed.   Luiz Blare OB Fellow 09/13/2016, 10:48 AM

## 2016-09-14 ENCOUNTER — Encounter (HOSPITAL_COMMUNITY): Payer: Self-pay

## 2016-09-14 LAB — GLUCOSE, CAPILLARY
GLUCOSE-CAPILLARY: 76 mg/dL (ref 65–99)
GLUCOSE-CAPILLARY: 84 mg/dL (ref 65–99)
Glucose-Capillary: 70 mg/dL (ref 65–99)

## 2016-09-14 MED ORDER — DIBUCAINE 1 % RE OINT: 1.0000 "application " | TOPICAL_OINTMENT | RECTAL | Status: DC | PRN

## 2016-09-14 MED ORDER — ZOLPIDEM TARTRATE 5 MG PO TABS: 5.0000 mg | ORAL_TABLET | Freq: Every evening | ORAL | Status: DC | PRN

## 2016-09-14 MED ORDER — PRENATAL MULTIVITAMIN CH
1.0000 | ORAL_TABLET | Freq: Every day | ORAL | Status: DC
  Administered 2016-09-15: 1 via ORAL
  Filled 2016-09-14: qty 1

## 2016-09-14 MED ORDER — WITCH HAZEL-GLYCERIN EX PADS: 1.0000 "application " | MEDICATED_PAD | CUTANEOUS | Status: DC | PRN

## 2016-09-14 MED ORDER — ONDANSETRON HCL 4 MG/2ML IJ SOLN: 4.0000 mg | INTRAMUSCULAR | Status: DC | PRN

## 2016-09-14 MED ORDER — COCONUT OIL OIL: 1.0000 "application " | TOPICAL_OIL | Status: DC | PRN

## 2016-09-14 MED ORDER — SIMETHICONE 80 MG PO CHEW: 80.0000 mg | CHEWABLE_TABLET | ORAL | Status: DC | PRN

## 2016-09-14 MED ORDER — ONDANSETRON HCL 4 MG PO TABS: 4.0000 mg | ORAL_TABLET | ORAL | Status: DC | PRN

## 2016-09-14 MED ORDER — DIPHENHYDRAMINE HCL 25 MG PO CAPS: 25.0000 mg | ORAL_CAPSULE | Freq: Four times a day (QID) | ORAL | Status: DC | PRN

## 2016-09-14 MED ORDER — IBUPROFEN 600 MG PO TABS
600.0000 mg | ORAL_TABLET | Freq: Four times a day (QID) | ORAL | Status: DC
  Administered 2016-09-14 – 2016-09-15 (×5): 600 mg via ORAL
  Filled 2016-09-14 (×5): qty 1

## 2016-09-14 MED ORDER — ACETAMINOPHEN 325 MG PO TABS
650.0000 mg | ORAL_TABLET | ORAL | Status: DC | PRN
  Administered 2016-09-14: 650 mg via ORAL
  Filled 2016-09-14: qty 2

## 2016-09-14 MED ORDER — TETANUS-DIPHTH-ACELL PERTUSSIS 5-2.5-18.5 LF-MCG/0.5 IM SUSP: 0.5000 mL | Freq: Once | INTRAMUSCULAR | Status: DC

## 2016-09-14 MED ORDER — BENZOCAINE-MENTHOL 20-0.5 % EX AERO
1.0000 "application " | INHALATION_SPRAY | CUTANEOUS | Status: DC | PRN
  Administered 2016-09-14: 1 via TOPICAL
  Filled 2016-09-14: qty 56

## 2016-09-14 MED ORDER — SENNOSIDES-DOCUSATE SODIUM 8.6-50 MG PO TABS
2.0000 | ORAL_TABLET | ORAL | Status: DC
  Administered 2016-09-15: 2 via ORAL
  Filled 2016-09-14: qty 2

## 2016-09-14 NOTE — Progress Notes (Addendum)
Patient ID: Catherine Houston, female   DOB: October 10, 1980, 36 y.o.   MRN: [id number]  Comfortable w/ epidural; SROM @ 0600  VSS, afeb FHR 140s, min LTV at times, +SS Ctx q 2-3 mins w/ Pit @ 59mu/min Cx 4+/70/-2- IUPC inserted without difficulty  IUP@term  Latent labor GDMA2  Hopeful now with rupture and IUPC that we can reach adequate labor  Serita Grammes CNM 09/14/2016 7:22 AM

## 2016-09-14 NOTE — Progress Notes (Signed)
Patient ID: Catherine Houston, female   DOB: Jan 11, 1981, 36 y.o.   MRN: [id number] Doing well  Vitals:   09/14/16 0830 09/14/16 0900 09/14/16 0930 09/14/16 1000  BP: 113/63 127/86 132/85 130/84  Pulse: 81 97 95 92  Resp: 18 18 18    Temp:    98.1 F (36.7 C)  TempSrc:    Oral  SpO2:      Weight:      Height:       FHR stable and reactive, Category I  UCs every 75min  Dilation: 9 Effacement (%): 100 Cervical Position: Posterior Station: 0, +1 Presentation: Vertex Exam by:: Evonnie Dawes, rn  Anticipated SVD

## 2016-09-14 NOTE — Progress Notes (Signed)
Patient ID: Catherine Houston, female   DOB: 1980-03-02, 36 y.o.   MRN: [id number]  Comfortable w/ epidural  BP 118/76, other VSS FHR 140s, +accels, no decels Ctx q 2-3 mins w/ Pit @ 10mu/min Cx deferred, but was 4/50  IUP@term  GDMA2 Latent labor  Continue to keep ctx regular w/ Pit; plan AROM for the future  Serita Grammes CNM 09/14/2016 2:41 AM

## 2016-09-14 NOTE — Anesthesia Postprocedure Evaluation (Signed)
Anesthesia Post Note  Patient: Catherine Houston  Procedure(s) Performed: * No procedures listed *     Patient location during evaluation: Mother Baby Anesthesia Type: Epidural Level of consciousness: awake and alert and oriented Pain management: pain level controlled Vital Signs Assessment: post-procedure vital signs reviewed and stable Respiratory status: spontaneous breathing and nonlabored ventilation Cardiovascular status: stable Postop Assessment: no headache, patient able to bend at knees, no backache, no signs of nausea or vomiting, epidural receding and adequate PO intake Anesthetic complications: no    Last Vitals:  Vitals:   09/14/16 1314 09/14/16 1713  BP: 136/75 127/66  Pulse: 89 77  Resp: 16 16  Temp: 36.9 C 36.8 C  SpO2:      Last Pain:  Vitals:   09/14/16 1714  TempSrc:   PainSc: 0-No pain   Pain Goal:                 [employer]

## 2016-09-15 LAB — GLUCOSE, CAPILLARY: Glucose-Capillary: 105 mg/dL — ABNORMAL HIGH (ref 65–99)

## 2016-09-15 MED ORDER — SENNOSIDES-DOCUSATE SODIUM 8.6-50 MG PO TABS: 2.0000 | ORAL_TABLET | ORAL | 0 refills | Status: DC

## 2016-09-15 MED ORDER — IBUPROFEN 600 MG PO TABS: 600.0000 mg | ORAL_TABLET | Freq: Four times a day (QID) | ORAL | 0 refills | Status: DC

## 2016-09-15 NOTE — Discharge Summary (Signed)
Obstetric Discharge Summary Reason for Admission: induction of labor and A2GDM Prenatal Procedures: ultrasound Intrapartum Procedures: spontaneous vaginal delivery Postpartum Procedures: none Complications-Operative and Postpartum: none  Delivery Note At 10:36 AM a viable female was delivered via Vaginal, Spontaneous Delivery (Presentation: OA; no difficulty with shoulders).  APGAR: 9, 9; weight pending.   Placenta status: spontaneous and grossly intact.  Cord: three vessel  with the following complications: none Anesthesia: epidural Episiotomy: None Lacerations: bilateral 1st degree labial tears, very superficial posterior 1st degree labial tear.  Hemostatic and will approximate without suturing Est. Blood Loss (mL): 100  Mom to postpartum.  Baby to Couplet care / Skin to Skin.  Hospital Course:  Active Problems:   Gestational diabetes   SVD (spontaneous vaginal delivery)  Catherine Houston is a 36 y.o. G6Y4034 s/p SVD.  Patient was admitted on 8/20 for IOL for A2GDM on glyburide 2.5mg .  She has postpartum course that was uncomplicated including no problems with ambulating, PO intake, urination, pain, or bleeding. She desires discharge because she would like to return to home to care for her special needs child and feels ready to go home. She is currently scheduled for outpatient follow up appointment with [facility]. Pt instructed to abstain from sex until follow up appointment and verbalized agreement. Precautions given regarding bleeding, pain, DVT, post-partum depression. Random CBGs during hospital stay were wnl. CBG prior to discharge was 102 postprandial. Glyburide discontinued.  Today: No acute events overnight.  Pt denies problems with ambulating, voiding or po intake.  She denies nausea or vomiting.  Pain is well controlled.  She has had flatus. She has had bowel movement.  Lochia Small.  Plan for birth control is  NuvaRing vaginal inserts.  Method of Feeding: Breast.  Physical Exam:   General: alert, cooperative, no distress and breastfeeding during exam Lochia: appropriate Uterine Fundus: firm Incision: N/A DVT Evaluation: no signs of DVT  H/H: Lab Results  Component Value Date/Time   HGB 12.5 09/13/2016 07:52 AM   HGB 11.7 07/01/2016 02:57 PM   HGB 12.4 02/23/2016   HCT 36.5 09/13/2016 07:52 AM   HCT 34.4 07/01/2016 02:57 PM   HCT 41 02/23/2016    Discharge Diagnoses: Term Pregnancy-delivered  Discharge Information: Date: 09/15/2016 Activity: pelvic rest Diet: routine  Medications: Ibuprofen and Colace Breast feeding:  Yes Condition: stable Instructions: refer to handout Discharge to: home    Allergies as of 09/15/2016   No Known Allergies     Medication List    STOP taking these medications   aspirin EC 81 MG tablet   glyBURIDE 2.5 MG tablet Commonly known as:  DIABETA   valACYclovir 500 MG tablet Commonly known as:  VALTREX     TAKE these medications   ACCU-CHEK FASTCLIX LANCETS Misc 1 Device by Percutaneous route 4 (four) times daily.   famotidine 10 MG chewable tablet Commonly known as:  PEPCID AC Chew 10 mg by mouth daily as needed for heartburn.   glucose blood test strip Commonly known as:  ACCU-CHEK GUIDE Use as instructed 4 times daily   ibuprofen 600 MG tablet Commonly known as:  ADVIL,MOTRIN Take 1 tablet (600 mg total) by mouth every 6 (six) hours.   PRENATAL VITAMIN PO Take 1 tablet by mouth at bedtime.   senna-docusate 8.6-50 MG tablet Commonly known as:  Senokot-S Take 2 tablets by mouth daily.            Discharge Care Instructions        Start  Ordered   09/16/16 0000  senna-docusate (SENOKOT-S) 8.6-50 MG tablet  Every 24 hours     09/15/16 1055   09/15/16 0000  ibuprofen (ADVIL,MOTRIN) 600 MG tablet  Every 6 hours     09/15/16 1055      Catherine Houston, Medical Student 09/15/2016,9:16 AM   OB FELLOW DISCHARGE ATTESTATION  I have seen and examined this patient. I agree with above  documentation and have made edits as needed.   Luiz Blare, DO OB Fellow 10:57 AM

## 2016-09-15 NOTE — Plan of Care (Signed)
Problem: Nutritional: Goal: Dietary intake will improve Outcome: Completed/Met Date Met: 09/15/16 Patient is eating three meals per day.

## 2016-09-15 NOTE — Lactation Note (Signed)
This note was copied from a baby's chart. Lactation Consultation Note  Patient Name: Catherine Houston [id number] Date: 09/15/2016 Reason for consult: Initial assessment Baby at 40 hr of life and mom desires d/c toady. Mom reports bf is going well. She denies breast or nipple pain. She voiced no cocnerns. She asked if [facility] would be to see her while she is still in-pt so she can get a DEBP. Lactation is not sure when or if they are seeing clt today. Mom was given a Harmony to take home. Discussed baby behavior, feeding frequency, baby belly size, voids, wt loss, breast changes, and nipple care. Mom stated she can manually express, has a spoon at the bedside, and knows how to spoon feed. Given lactation handouts. Aware of OP services and support group. Mom will call at the next feeding for lactation to view a latch before they go home today.    Maternal Data Has patient been taught Hand Expression?: Yes Does the patient have breastfeeding experience prior to this delivery?: Yes  Feeding Feeding Type: Breast Fed Length of feed: 15 min  LATCH Score                   Interventions    Lactation Tools Discussed/Used WIC Program: Yes Pump Review: Setup, frequency, and cleaning;Milk Storage Initiated by:: ES Date initiated:: 09/15/16   Consult Status Consult Status: Follow-up Date: 09/15/16 Follow-up type: In-patient    Denzil Hughes 09/15/2016, 11:13 AM

## 2016-09-15 NOTE — Discharge Instructions (Signed)

## 2016-09-19 DIAGNOSIS — O480 Post-term pregnancy: Secondary | ICD-10-CM | POA: Diagnosis not present

## 2016-09-19 DIAGNOSIS — Z3A40 40 weeks gestation of pregnancy: Secondary | ICD-10-CM | POA: Diagnosis not present

## 2016-09-19 DIAGNOSIS — O99824 Streptococcus B carrier state complicating childbirth: Secondary | ICD-10-CM | POA: Diagnosis not present

## 2016-10-26 ENCOUNTER — Ambulatory Visit (INDEPENDENT_AMBULATORY_CARE_PROVIDER_SITE_OTHER): Payer: Medicaid Other | Admitting: Student

## 2016-10-26 ENCOUNTER — Encounter: Payer: Self-pay | Admitting: Student

## 2016-10-26 DIAGNOSIS — Z30015 Encounter for initial prescription of vaginal ring hormonal contraceptive: Secondary | ICD-10-CM

## 2016-10-26 DIAGNOSIS — O2493 Unspecified diabetes mellitus in the puerperium: Secondary | ICD-10-CM

## 2016-10-26 LAB — POCT PREGNANCY, URINE: PREG TEST UR: NEGATIVE

## 2016-10-26 MED ORDER — ETONOGESTREL-ETHINYL ESTRADIOL 0.12-0.015 MG/24HR VA RING: VAGINAL_RING | VAGINAL | 12 refills | Status: DC

## 2016-10-26 NOTE — Progress Notes (Signed)
Subjective:     Catherine Houston is a 36 y.o. female who presents for a postpartum visit. She is 6 weeks postpartum following a spontaneous vaginal delivery. I have fully reviewed the prenatal and intrapartum course. The delivery was at 13 gestational weeks. Outcome: spontaneous vaginal delivery. Anesthesia: epidural. Postpartum course has been unremarkable. Baby's course has been unremarkable. Baby is feeding by breast. Bleeding no bleeding. Bowel function is normal. Bladder function is normal. Patient is not sexually active. Contraception method is none. Postpartum depression screening: negative.  The following portions of the patient's history were reviewed and updated as appropriate: allergies, current medications, past family history, past medical history, past social history, past surgical history and problem list.  Review of Systems Pertinent items are noted in HPI.   Objective:    BP 111/73   Pulse 76   Wt 178 lb 9.6 oz (81 kg)   Breastfeeding? Yes   BMI 32.67 kg/m   General:  alert, cooperative, appears stated age, no distress and moderately obese  Lungs: clear to auscultation bilaterally  Heart:  regular rate and rhythm, S1, S2 normal, no murmur, click, rub or gallop  Abdomen: soft, non-tender; bowel sounds normal; no masses,  no organomegaly        Assessment:     Normal postpartum exam. Pap smear not done at today's visit. Pap due 06/2017.  GDM during this pregnancy. 2hr gtt collected today.   Plan:   1. Postpartum care and examination   2. Diabetes mellitus, postpartum  - Glucose tolerance, 2 hours  3. Encounter for initial prescription of vaginal ring hormonal contraceptive  - etonogestrel-ethinyl estradiol (NUVARING) 0.12-0.015 MG/24HR vaginal ring; Insert vaginally and leave in place for 3 consecutive weeks, then remove for 1 week.  Dispense: 1 each; Refill: 12  F/u for pap smear 06/2017  Jorje Guild, NP

## 2016-10-26 NOTE — Patient Instructions (Signed)
Health Maintenance, Female Adopting a healthy lifestyle and getting preventive care can go a long way to promote health and wellness. Talk with your health care provider about what schedule of regular examinations is right for you. This is a good chance for you to check in with your provider about disease prevention and staying healthy. In between checkups, there are plenty of things you can do on your own. Experts have done a lot of research about which lifestyle changes and preventive measures are most likely to keep you healthy. Ask your health care provider for more information. Weight and diet Eat a healthy diet  Be sure to include plenty of vegetables, fruits, low-fat dairy products, and lean protein.  Do not eat a lot of foods high in solid fats, added sugars, or salt.  Get regular exercise. This is one of the most important things you can do for your health. ? Most adults should exercise for at least 150 minutes each week. The exercise should increase your heart rate and make you sweat (moderate-intensity exercise). ? Most adults should also do strengthening exercises at least twice a week. This is in addition to the moderate-intensity exercise.  Maintain a healthy weight  Body mass index (BMI) is a measurement that can be used to identify possible weight problems. It estimates body fat based on height and weight. Your health care provider can help determine your BMI and help you achieve or maintain a healthy weight.  For females 20 years of age and older: ? A BMI below 18.5 is considered underweight. ? A BMI of 18.5 to 24.9 is normal. ? A BMI of 25 to 29.9 is considered overweight. ? A BMI of 30 and above is considered obese.  Watch levels of cholesterol and blood lipids  You should start having your blood tested for lipids and cholesterol at 35 years of age, then have this test every 5 years.  You may need to have your cholesterol levels checked more often if: ? Your lipid or  cholesterol levels are high. ? You are older than 35 years of age. ? You are at high risk for heart disease.  Cancer screening Lung Cancer  Lung cancer screening is recommended for adults 55-80 years old who are at high risk for lung cancer because of a history of smoking.  A yearly low-dose CT scan of the lungs is recommended for people who: ? Currently smoke. ? Have quit within the past 15 years. ? Have at least a 30-pack-year history of smoking. A pack year is smoking an average of one pack of cigarettes a day for 1 year.  Yearly screening should continue until it has been 15 years since you quit.  Yearly screening should stop if you develop a health problem that would prevent you from having lung cancer treatment.  Breast Cancer  Practice breast self-awareness. This means understanding how your breasts normally appear and feel.  It also means doing regular breast self-exams. Let your health care provider know about any changes, no matter how small.  If you are in your 20s or 30s, you should have a clinical breast exam (CBE) by a health care provider every 1-3 years as part of a regular health exam.  If you are 40 or older, have a CBE every year. Also consider having a breast X-ray (mammogram) every year.  If you have a family history of breast cancer, talk to your health care provider about genetic screening.  If you are at high risk   for breast cancer, talk to your health care provider about having an MRI and a mammogram every year.  Breast cancer gene (BRCA) assessment is recommended for women who have family members with BRCA-related cancers. BRCA-related cancers include: ? Breast. ? Ovarian. ? Tubal. ? Peritoneal cancers.  Results of the assessment will determine the need for genetic counseling and BRCA1 and BRCA2 testing.  Cervical Cancer Your health care provider may recommend that you be screened regularly for cancer of the pelvic organs (ovaries, uterus, and  vagina). This screening involves a pelvic examination, including checking for microscopic changes to the surface of your cervix (Pap test). You may be encouraged to have this screening done every 3 years, beginning at age 22.  For women ages 56-65, health care providers may recommend pelvic exams and Pap testing every 3 years, or they may recommend the Pap and pelvic exam, combined with testing for human papilloma virus (HPV), every 5 years. Some types of HPV increase your risk of cervical cancer. Testing for HPV may also be done on women of any age with unclear Pap test results.  Other health care providers may not recommend any screening for nonpregnant women who are considered low risk for pelvic cancer and who do not have symptoms. Ask your health care provider if a screening pelvic exam is right for you.  If you have had past treatment for cervical cancer or a condition that could lead to cancer, you need Pap tests and screening for cancer for at least 20 years after your treatment. If Pap tests have been discontinued, your risk factors (such as having a new sexual partner) need to be reassessed to determine if screening should resume. Some women have medical problems that increase the chance of getting cervical cancer. In these cases, your health care provider may recommend more frequent screening and Pap tests.  Colorectal Cancer  This type of cancer can be detected and often prevented.  Routine colorectal cancer screening usually begins at 35 years of age and continues through 35 years of age.  Your health care provider may recommend screening at an earlier age if you have risk factors for colon cancer.  Your health care provider may also recommend using home test kits to check for hidden blood in the stool.  A small camera at the end of a tube can be used to examine your colon directly (sigmoidoscopy or colonoscopy). This is done to check for the earliest forms of colorectal  cancer.  Routine screening usually begins at age 33.  Direct examination of the colon should be repeated every 5-10 years through 35 years of age. However, you may need to be screened more often if early forms of precancerous polyps or small growths are found.  Skin Cancer  Check your skin from head to toe regularly.  Tell your health care provider about any new moles or changes in moles, especially if there is a change in a mole's shape or color.  Also tell your health care provider if you have a mole that is larger than the size of a pencil eraser.  Always use sunscreen. Apply sunscreen liberally and repeatedly throughout the day.  Protect yourself by wearing long sleeves, pants, a wide-brimmed hat, and sunglasses whenever you are outside.  Heart disease, diabetes, and high blood pressure  High blood pressure causes heart disease and increases the risk of stroke. High blood pressure is more likely to develop in: ? People who have blood pressure in the high end of  the normal range (130-139/85-89 mm Hg). ? People who are overweight or obese. ? People who are African American.  If you are 21-29 years of age, have your blood pressure checked every 3-5 years. If you are 3 years of age or older, have your blood pressure checked every year. You should have your blood pressure measured twice-once when you are at a hospital or clinic, and once when you are not at a hospital or clinic. Record the average of the two measurements. To check your blood pressure when you are not at a hospital or clinic, you can use: ? An automated blood pressure machine at a pharmacy. ? A home blood pressure monitor.  If you are between 17 years and 37 years old, ask your health care provider if you should take aspirin to prevent strokes.  Have regular diabetes screenings. This involves taking a blood sample to check your fasting blood sugar level. ? If you are at a normal weight and have a low risk for diabetes,  have this test once every three years after 35 years of age. ? If you are overweight and have a high risk for diabetes, consider being tested at a younger age or more often. Preventing infection Hepatitis B  If you have a higher risk for hepatitis B, you should be screened for this virus. You are considered at high risk for hepatitis B if: ? You were born in a country where hepatitis B is common. Ask your health care provider which countries are considered high risk. ? Your parents were born in a high-risk country, and you have not been immunized against hepatitis B (hepatitis B vaccine). ? You have HIV or AIDS. ? You use needles to inject street drugs. ? You live with someone who has hepatitis B. ? You have had sex with someone who has hepatitis B. ? You get hemodialysis treatment. ? You take certain medicines for conditions, including cancer, organ transplantation, and autoimmune conditions.  Hepatitis C  Blood testing is recommended for: ? Everyone born from [birth year] through 1964. ? Anyone with known risk factors for hepatitis C.  Sexually transmitted infections (STIs)  You should be screened for sexually transmitted infections (STIs) including gonorrhea and chlamydia if: ? You are sexually active and are younger than 35 years of age. ? You are older than 36 years of age and your health care provider tells you that you are at risk for this type of infection. ? Your sexual activity has changed since you were last screened and you are at an increased risk for chlamydia or gonorrhea. Ask your health care provider if you are at risk.  If you do not have HIV, but are at risk, it may be recommended that you take a prescription medicine daily to prevent HIV infection. This is called pre-exposure prophylaxis (PrEP). You are considered at risk if: ? You are sexually active and do not regularly use condoms or know the HIV status of your partner(s). ? You take drugs by injection. ? You are  sexually active with a partner who has HIV.  Talk with your health care provider about whether you are at high risk of being infected with HIV. If you choose to begin PrEP, you should first be tested for HIV. You should then be tested every 3 months for as long as you are taking PrEP. Pregnancy  If you are premenopausal and you may become pregnant, ask your health care provider about preconception counseling.  If you may become  pregnant, take 400 to 800 micrograms (mcg) of folic acid every day.  If you want to prevent pregnancy, talk to your health care provider about birth control (contraception). Osteoporosis and menopause  Osteoporosis is a disease in which the bones lose minerals and strength with aging. This can result in serious bone fractures. Your risk for osteoporosis can be identified using a bone density scan.  If you are 65 years of age or older, or if you are at risk for osteoporosis and fractures, ask your health care provider if you should be screened.  Ask your health care provider whether you should take a calcium or vitamin D supplement to lower your risk for osteoporosis.  Menopause may have certain physical symptoms and risks.  Hormone replacement therapy may reduce some of these symptoms and risks. Talk to your health care provider about whether hormone replacement therapy is right for you. Follow these instructions at home:  Schedule regular health, dental, and eye exams.  Stay current with your immunizations.  Do not use any tobacco products including cigarettes, chewing tobacco, or electronic cigarettes.  If you are pregnant, do not drink alcohol.  If you are breastfeeding, limit how much and how often you drink alcohol.  Limit alcohol intake to no more than 1 drink per day for nonpregnant women. One drink equals 12 ounces of beer, 5 ounces of wine, or 1 ounces of hard liquor.  Do not use street drugs.  Do not share needles.  Ask your health care  provider for help if you need support or information about quitting drugs.  Tell your health care provider if you often feel depressed.  Tell your health care provider if you have ever been abused or do not feel safe at home. This information is not intended to replace advice given to you by your health care provider. Make sure you discuss any questions you have with your health care provider. Document Released: 07/27/2010 Document Revised: 06/19/2015 Document Reviewed: 10/15/2014 Elsevier Interactive Patient Education  2018 [employer]   Ethinyl Estradiol; Etonogestrel vaginal ring What is this medicine? ETHINYL ESTRADIOL; ETONOGESTREL (ETH in il es tra DYE ole; et oh noe JES trel) vaginal ring is a flexible, vaginal ring used as a contraceptive (birth control method). This medicine combines two types of female hormones, an estrogen and a progestin. This ring is used to prevent ovulation and pregnancy. Each ring is effective for one month. This medicine may be used for other purposes; ask your health care provider or pharmacist if you have questions. COMMON BRAND NAME(S): NuvaRing What should I tell my health care provider before I take this medicine? They need to know if you have or ever had any of these conditions: -abnormal vaginal bleeding -blood vessel disease or blood clots -breast, cervical, endometrial, ovarian, liver, or uterine cancer -diabetes -gallbladder disease -heart disease or recent heart attack -high blood pressure -high cholesterol -kidney disease -liver disease -migraine headaches -stroke -systemic lupus erythematosus (SLE) -tobacco smoker -an unusual or allergic reaction to estrogens, progestins, other medicines, foods, dyes, or preservatives -pregnant or trying to get pregnant -breast-feeding How should I use this medicine? Insert the ring into your vagina as directed. Follow the directions on the prescription label. The ring will remain place for 3 weeks  and is then removed for a 1-week break. A new ring is inserted 1 week after the last ring was removed, on the same day of the week. Check often to make sure the ring is still in place,   especially before and after sexual intercourse. If the ring was out of the vagina for an unknown amount of time, you may not be protected from pregnancy. Perform a pregnancy test and call your doctor. Do not use more often than directed. A patient package insert for the product will be given with each prescription and refill. Read this sheet carefully each time. The sheet may change frequently. Contact your pediatrician regarding the use of this medicine in children. Special care may be needed. This medicine has been used in female children who have started having menstrual periods. Overdosage: If you think you have taken too much of this medicine contact a poison control center or emergency room at once. NOTE: This medicine is only for you. Do not share this medicine with others. What if I miss a dose? You will need to replace your vaginal ring once a month as directed. If the ring should slip out, or if you leave it in longer or shorter than you should, contact your health care professional for advice. What may interact with this medicine? Do not take this medicine with the following medication: -dasabuvir; ombitasvir; paritaprevir; ritonavir -ombitasvir; paritaprevir; ritonavir This medicine may also interact with the following medications: -acetaminophen -antibiotics or medicines for infections, especially rifampin, rifabutin, rifapentine, and griseofulvin, and possibly penicillins or tetracyclines -aprepitant -ascorbic acid (vitamin C) -atorvastatin -barbiturate medicines, such as phenobarbital -bosentan -carbamazepine -caffeine -clofibrate -cyclosporine -dantrolene -doxercalciferol -felbamate -grapefruit juice -hydrocortisone -medicines for anxiety or sleeping problems, such as diazepam or  temazepam -medicines for diabetes, including pioglitazone -modafinil -mycophenolate -nefazodone -oxcarbazepine -phenytoin -prednisolone -ritonavir or other medicines for HIV infection or AIDS -rosuvastatin -selegiline -soy isoflavones supplements -St. John's wort -tamoxifen or raloxifene -theophylline -thyroid hormones -topiramate -warfarin This list may not describe all possible interactions. Give your health care provider a list of all the medicines, herbs, non-prescription drugs, or dietary supplements you use. Also tell them if you smoke, drink alcohol, or use illegal drugs. Some items may interact with your medicine. What should I watch for while using this medicine? Visit your doctor or health care professional for regular checks on your progress. You will need a regular breast and pelvic exam and Pap smear while on this medicine. Use an additional method of contraception during the first cycle that you use this ring. Do not use a diaphragm or female condom, as the ring can interfere with these birth control methods and their proper placement. If you have any reason to think you are pregnant, stop using this medicine right away and contact your doctor or health care professional. If you are using this medicine for hormone related problems, it may take several cycles of use to see improvement in your condition. Smoking increases the risk of getting a blood clot or having a stroke while you are using hormonal birth control, especially if you are more than 35 years old. You are strongly advised not to smoke. This medicine can make your body retain fluid, making your fingers, hands, or ankles swell. Your blood pressure can go up. Contact your doctor or health care professional if you feel you are retaining fluid. This medicine can make you more sensitive to the sun. Keep out of the sun. If you cannot avoid being in the sun, wear protective clothing and use sunscreen. Do not use sun lamps  or tanning beds/booths. If you wear contact lenses and notice visual changes, or if the lenses begin to feel uncomfortable, consult your eye care specialist. In some women, tenderness, swelling,   or minor bleeding of the gums may occur. Notify your dentist if this happens. Brushing and flossing your teeth regularly may help limit this. See your dentist regularly and inform your dentist of the medicines you are taking. If you are going to have elective surgery, you may need to stop using this medicine before the surgery. Consult your health care professional for advice. This medicine does not protect you against HIV infection (AIDS) or any other sexually transmitted diseases. What side effects may I notice from receiving this medicine? Side effects that you should report to your doctor or health care professional as soon as possible: -breast tissue changes or discharge -changes in vaginal bleeding during your period or between your periods -chest pain -coughing up blood -dizziness or fainting spells -headaches or migraines -leg, arm or groin pain -severe or sudden headaches -stomach pain (severe) -sudden shortness of breath -sudden loss of coordination, especially on one side of the body -speech problems -symptoms of vaginal infection like itching, irritation or unusual discharge -tenderness in the upper abdomen -vomiting -weakness or numbness in the arms or legs, especially on one side of the body -yellowing of the eyes or skin Side effects that usually do not require medical attention (report to your doctor or health care professional if they continue or are bothersome): -breakthrough bleeding and spotting that continues beyond the 3 initial cycles of pills -breast tenderness -mood changes, anxiety, depression, frustration, anger, or emotional outbursts -increased sensitivity to sun or ultraviolet light -nausea -skin rash, acne, or brown spots on the skin -weight gain (slight) This  list may not describe all possible side effects. Call your doctor for medical advice about side effects. You may report side effects to FDA at 1-800-FDA-1088. Where should I keep my medicine? Keep out of the reach of children. Store at room temperature between 15 and 30 degrees C (59 and 86 degrees F) for up to 4 months. The product will expire after 4 months. Protect from light. Throw away any unused medicine after the expiration date. NOTE: This sheet is a summary. It may not cover all possible information. If you have questions about this medicine, talk to your doctor, pharmacist, or health care provider.  2018 [employer]vier/[employer] Standard (2015-09-19 17:00:31)  

## 2016-10-27 LAB — GLUCOSE TOLERANCE, 2 HOURS
Glucose, 2 hour: 213 mg/dL — ABNORMAL HIGH (ref 65–139)
Glucose, GTT - Fasting: 105 mg/dL — ABNORMAL HIGH (ref 65–99)

## 2016-11-06 ENCOUNTER — Ambulatory Visit (INDEPENDENT_AMBULATORY_CARE_PROVIDER_SITE_OTHER): Payer: Self-pay | Admitting: Family Medicine

## 2016-11-06 ENCOUNTER — Encounter: Payer: Self-pay | Admitting: Family Medicine

## 2016-11-06 VITALS — BP 108/74 | HR 74 | Temp 97.8°F | Resp 16 | Ht 62.0 in | Wt 179.6 lb

## 2016-11-06 DIAGNOSIS — E7439 Other disorders of intestinal carbohydrate absorption: Secondary | ICD-10-CM

## 2016-11-06 DIAGNOSIS — Z6831 Body mass index (BMI) 31.0-31.9, adult: Secondary | ICD-10-CM

## 2016-11-06 DIAGNOSIS — O24419 Gestational diabetes mellitus in pregnancy, unspecified control: Secondary | ICD-10-CM

## 2016-11-06 DIAGNOSIS — Z789 Other specified health status: Secondary | ICD-10-CM

## 2016-11-06 LAB — POCT URINALYSIS DIP (MANUAL ENTRY)
BILIRUBIN UA: NEGATIVE
BILIRUBIN UA: NEGATIVE mg/dL
Glucose, UA: NEGATIVE mg/dL
Leukocytes, UA: NEGATIVE
Nitrite, UA: NEGATIVE
PH UA: 6 (ref 5.0–8.0)
PROTEIN UA: NEGATIVE mg/dL
RBC UA: NEGATIVE
Spec Grav, UA: 1.025 (ref 1.010–1.025)
Urobilinogen, UA: 0.2 E.U./dL

## 2016-11-06 LAB — POCT GLYCOSYLATED HEMOGLOBIN (HGB A1C): Hemoglobin A1C: 6.2

## 2016-11-06 LAB — GLUCOSE, POCT (MANUAL RESULT ENTRY): POC Glucose: 125 mg/dl — AB (ref 70–99)

## 2016-11-06 NOTE — Progress Notes (Signed)
Subjective:    Patient ID: Female Catherine Houston, female    DOB: Jun 12, 1980, 36 y.o.   MRN: [id number]  11/06/2016  Gestational Diabetes (failed Glucose tolerance test after having her baby and was told to follow up with PCP )    HPI This 36 y.o. female presents for evaluation of gestational diabetes.  August 2018, labs all normal.  Recommended Delivery seven weeks ago.  Feeling well.  Fasting sugars running 110. Postprandial 135s.  Has not taken Glyburide since delivery. Ate flour tortilla, ice pop.  Breastfeeding.  Prepregnancy weight 175#.  Always dieting.   Gained 186 with pregnancy.  Nutrition consultation. Trying to lose more weight.   Walking but no formal exercise program.  Walking 30 minutes 4 days per week. 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 Has always weighed 160-175#.   Coping well with newborn; denies anxiety, feeling overwhelmed, excessive worry or excessive sadness.   BP Readings from Last 3 Encounters:  11/06/16 108/74  10/26/16 111/73  09/15/16 118/64   Wt Readings from Last 3 Encounters:  11/06/16 179 lb 9.6 oz (81.5 kg)  10/26/16 178 lb 9.6 oz (81 kg)  09/13/16 188 lb (85.3 kg)   Immunization History  Administered Date(s) Administered  . Hepatitis B 04/07/2006, 05/24/2006, 10/13/2006  . Hpv 06/26/2006, 10/13/2006, 02/23/2007  . Tdap 07/01/2016    Review of Systems  Constitutional: Negative for chills, diaphoresis, fatigue and fever.  Eyes: Negative for visual disturbance.  Respiratory: Negative for cough and shortness of breath.   Cardiovascular: Negative for chest pain, palpitations and leg swelling.  Gastrointestinal: Negative for abdominal pain, constipation, diarrhea, nausea and vomiting.  Endocrine: Negative for cold intolerance, heat intolerance, polydipsia, polyphagia and polyuria.  Neurological: Negative for dizziness, tremors, seizures, syncope, facial asymmetry, speech difficulty, weakness, light-headedness, numbness and  headaches.    Past Medical History:  Diagnosis Date  . Genital HSV    HSV1  . Gestational diabetes   . UTI (urinary tract infection)    2015   Past Surgical History:  Procedure Laterality Date  . NO PAST SURGERIES     No Known Allergies Current Outpatient Prescriptions on File Prior to Visit  Medication Sig Dispense Refill  . etonogestrel-ethinyl estradiol (NUVARING) 0.12-0.015 MG/24HR vaginal ring Insert vaginally and leave in place for 3 consecutive weeks, then remove for 1 week. 1 each 12  . Prenatal Vit-Fe Fumarate-FA (PRENATAL VITAMIN PO) Take 1 tablet by mouth at bedtime.     Marland Kitchen ACCU-CHEK FASTCLIX LANCETS MISC 1 Device by Percutaneous route 4 (four) times daily. (Patient not taking: Reported on 10/26/2016) 100 each 12  . famotidine (PEPCID AC) 10 MG chewable tablet Chew 10 mg by mouth daily as needed for heartburn.    Marland Kitchen glucose blood (ACCU-CHEK GUIDE) test strip Use as instructed 4 times daily (Patient not taking: Reported on 10/26/2016) 100 each 12  . ibuprofen (ADVIL,MOTRIN) 600 MG tablet Take 1 tablet (600 mg total) by mouth every 6 (six) hours. (Patient not taking: Reported on 10/26/2016) 30 tablet 0  . senna-docusate (SENOKOT-S) 8.6-50 MG tablet Take 2 tablets by mouth daily. (Patient not taking: Reported on 10/26/2016) 30 tablet 0   No current facility-administered medications on file prior to visit.    Social History   Social History  . Marital status: Divorced    Spouse name: N/A  . Number of children: N/A  . Years of education: N/A   Occupational History  . Not on file.   Social History Main Topics  .  Smoking status: Never Smoker  . Smokeless tobacco: Never Used  . Alcohol use No  . Drug use: No  . Sexual activity: Yes    Birth control/ protection: None   Other Topics Concern  . Not on file   Social History Narrative   Marital status: married x 2017      Children: 2 children (4 yo, newborn)      Lives: with husband, 2 children      Employment:  homemaker      Tobacco: none       Alcohol: none       Exercise: walking 30 minutes daily.   Family History  Problem Relation Age of Onset  . Diabetes Mother   . Hypertension Mother   . Diabetes Father        Objective:    BP 108/74   Pulse 74   Temp 97.8 F (36.6 C)   Resp 16   Ht 5\' 2"  (1.575 m)   Wt 179 lb 9.6 oz (81.5 kg)   SpO2 97%   BMI 32.85 kg/m  Physical Exam  Constitutional: She is oriented to person, place, and time. She appears well-developed and well-nourished. No distress.  HENT:  Head: Normocephalic and atraumatic.  Right Ear: External ear normal.  Left Ear: External ear normal.  Nose: Nose normal.  Mouth/Throat: Oropharynx is clear and moist.  Eyes: Pupils are equal, round, and reactive to light. Conjunctivae and EOM are normal.  Neck: Normal range of motion. Neck supple. Carotid bruit is not present. No thyromegaly present.  Cardiovascular: Normal rate, regular rhythm, normal heart sounds and intact distal pulses.  Exam reveals no gallop and no friction rub.   No murmur heard. Pulmonary/Chest: Effort normal and breath sounds normal. She has no wheezes. She has no rales.  Abdominal: Soft. Bowel sounds are normal. She exhibits no distension and no mass. There is no tenderness. There is no rebound and no guarding.  Lymphadenopathy:    She has no cervical adenopathy.  Neurological: She is alert and oriented to person, place, and time. No cranial nerve deficit.  Skin: Skin is warm and dry. No rash noted. She is not diaphoretic. No erythema. No pallor.  Psychiatric: She has a normal mood and affect. Her behavior is normal.   No results found. Depression screen [facility] 2/9 11/06/2016 10/26/2016 09/10/2016 08/27/2016 08/20/2016  Decreased Interest 0 0 0 0 0  Down, Depressed, Hopeless 0 0 0 0 0  PHQ - 2 Score 0 0 0 0 0  Altered sleeping - 0 0 0 0  Tired, decreased energy - 0 0 0 0  Change in appetite - 0 0 0 0  Feeling bad or failure about yourself  - 0 0 0 0    Trouble concentrating - 0 0 0 0  Moving slowly or fidgety/restless - 0 0 0 0  Suicidal thoughts - 0 0 0 0  PHQ-9 Score - 0 0 0 0  Some recent data might be hidden   Fall Risk  11/06/2016 08/20/2016 07/23/2016 06/24/2016 05/13/2016  Falls in the past year? No No No No No   Results for orders placed or performed in visit on 11/06/16  POCT glycosylated hemoglobin (Hb A1C)  Result Value Ref Range   Hemoglobin A1C 6.2   POCT glucose (manual entry)  Result Value Ref Range   POC Glucose 125 (A) 70 - 99 mg/dl  POCT urinalysis dipstick  Result Value Ref Range   Color, UA yellow yellow  Clarity, UA clear clear   Glucose, UA negative negative mg/dL   Bilirubin, UA negative negative   Ketones, POC UA negative negative mg/dL   Spec Grav, UA 1.025 1.010 - 1.025   Blood, UA negative negative   pH, UA 6.0 5.0 - 8.0   Protein Ur, POC negative negative mg/dL   Urobilinogen, UA 0.2 0.2 or 1.0 E.U./dL   Nitrite, UA Negative Negative   Leukocytes, UA Negative Negative        Assessment & Plan:   1. GDM, class A2   2. Glucose intolerance   3. BMI 31.0-31.9,adult   4. Breastfeeding (infant)    -Gestational diabetes maintained on Glyburide during pregnancy. -no medication indicated at this time. - I recommend weight loss, exercise, and low-carbohydrate low-sugar food choices. You should AVOID: regular sodas, sweetened tea, fruit juices.  You should LIMIT: breads, pastas, rice, potatoes, and desserts/sweets.  I would recommend limiting your total carbohydrate intake per meal to 45 grams; I would limit your total carbohydrate intake per snack to 30 grams.  I would also have a goal of 60 grams of protein intake per day; this would equal 10-15 grams of protein per meal and 5-10 grams of protein per snack. -recommend weight loss, exercise for 30-60 minutes five days per week; recommend 1800 kcal restriction per day (due to breastfeeding) with a minimum of 60 grams of protein per day. -goal weight of  150 pounds long-term with initial weight loss goal of 160 pounds. -increase exercise to five days per week.  Orders Placed This Encounter  Procedures  . Comprehensive metabolic panel  . TSH  . POCT glycosylated hemoglobin (Hb A1C)  . POCT glucose (manual entry)  . POCT urinalysis dipstick   No orders of the defined types were placed in this encounter.   Return in about 6 months (around 05/07/2017) for recheck weight and sugar level.Norwood Levo, M.D. Primary Care at [facility] [place] [address] [place], [place]  [address] [phone] phone [phone] fax

## 2016-11-06 NOTE — Patient Instructions (Addendum)
-  recommend weight loss, exercise for 30-60 minutes five days per week; recommend 1800 kcal restriction per day with a minimum of 60 grams of protein per day. -increase exercise to five days per week.      IF you received an x-ray today, you will receive an invoice from [facility]. Please contact [facility] at [phone] with questions or concerns regarding your invoice.   IF you received labwork today, you will receive an invoice from [facility]. Please contact [facility] at [phone] with questions or concerns regarding your invoice.   Our billing staff will not be able to assist you with questions regarding bills from these companies.  You will be contacted with the lab results as soon as they are available. The fastest way to get your results is to activate your My Chart account. Instructions are located on the last page of this paperwork. If you have not heard from [place] regarding the results in 2 weeks, please contact this office.

## 2016-11-07 LAB — COMPREHENSIVE METABOLIC PANEL
A/G RATIO: 1.6 (ref 1.2–2.2)
ALK PHOS: 74 IU/L (ref 39–117)
ALT: 49 IU/L — AB (ref 0–32)
AST: 31 IU/L (ref 0–40)
Albumin: 4.5 g/dL (ref 3.5–5.5)
BILIRUBIN TOTAL: 0.2 mg/dL (ref 0.0–1.2)
BUN/Creatinine Ratio: 23 (ref 9–23)
BUN: 12 mg/dL (ref 6–20)
CHLORIDE: 103 mmol/L (ref 96–106)
CO2: 22 mmol/L (ref 20–29)
Calcium: 9.4 mg/dL (ref 8.7–10.2)
Creatinine, Ser: 0.53 mg/dL — ABNORMAL LOW (ref 0.57–1.00)
GFR calc non Af Amer: 123 mL/min/{1.73_m2} (ref >59)
GFR, EST AFRICAN AMERICAN: 142 mL/min/{1.73_m2} (ref >59)
Globulin, Total: 2.9 g/dL (ref 1.5–4.5)
Glucose: 120 mg/dL — ABNORMAL HIGH (ref 65–99)
POTASSIUM: 4.4 mmol/L (ref 3.5–5.2)
Sodium: 141 mmol/L (ref 134–144)
Total Protein: 7.4 g/dL (ref 6.0–8.5)

## 2016-11-07 LAB — TSH: TSH: 1.99 u[IU]/mL (ref 0.450–4.500)

## 2017-06-10 ENCOUNTER — Encounter: Payer: Self-pay | Admitting: Family Medicine

## 2017-06-15 ENCOUNTER — Encounter: Payer: Self-pay | Admitting: Family Medicine

## 2018-11-06 ENCOUNTER — Other Ambulatory Visit: Payer: Self-pay

## 2018-11-06 ENCOUNTER — Encounter: Payer: Self-pay | Admitting: Obstetrics and Gynecology

## 2018-11-06 ENCOUNTER — Ambulatory Visit (INDEPENDENT_AMBULATORY_CARE_PROVIDER_SITE_OTHER): Payer: Medicaid Other | Admitting: Obstetrics and Gynecology

## 2018-11-06 ENCOUNTER — Other Ambulatory Visit (HOSPITAL_COMMUNITY)
Admission: RE | Admit: 2018-11-06 | Discharge: 2018-11-06 | Disposition: A | Discharge status: Home / Self Care | Payer: Medicaid Other | Source: Ambulatory Visit | Attending: Obstetrics and Gynecology | Admitting: Obstetrics and Gynecology

## 2018-11-06 VITALS — BP 112/75 | HR 60 | Wt 179.1 lb

## 2018-11-06 DIAGNOSIS — Z8632 Personal history of gestational diabetes: Secondary | ICD-10-CM

## 2018-11-06 DIAGNOSIS — Z3480 Encounter for supervision of other normal pregnancy, unspecified trimester: Secondary | ICD-10-CM

## 2018-11-06 DIAGNOSIS — Z23 Encounter for immunization: Secondary | ICD-10-CM

## 2018-11-06 DIAGNOSIS — O24410 Gestational diabetes mellitus in pregnancy, diet controlled: Secondary | ICD-10-CM

## 2018-11-06 DIAGNOSIS — O0990 Supervision of high risk pregnancy, unspecified, unspecified trimester: Secondary | ICD-10-CM | POA: Insufficient documentation

## 2018-11-06 HISTORY — DX: Personal history of gestational diabetes: Z86.32

## 2018-11-06 MED ORDER — ACCU-CHEK GUIDE VI STRP: ORAL_STRIP | 12 refills | Status: DC

## 2018-11-06 MED ORDER — BLOOD PRESSURE CUFF MISC: 1.0000 | 0 refills | Status: DC

## 2018-11-06 MED ORDER — ACCU-CHEK FASTCLIX LANCETS MISC: 1.0000 | Freq: Four times a day (QID) | 12 refills | Status: DC

## 2018-11-06 NOTE — Progress Notes (Signed)
Pt is here today for initial prenatal visit. LMP 08/21/2018, EDD 05/28/19. Pt reports that she did have spotting about a month ago but has not had any more bleeding since.

## 2018-11-06 NOTE — Patient Instructions (Signed)
First Trimester of Pregnancy The first trimester of pregnancy is from week 1 until the end of week 13 (months 1 through 3). A week after a sperm fertilizes an egg, the egg will implant on the wall of the uterus. This embryo will begin to develop into a baby. Genes from you and your partner will form the baby. The female genes will determine whether the baby will be a boy or a girl. At 6-8 weeks, the eyes and face will be formed, and the heartbeat can be seen on ultrasound. At the end of 12 weeks, all the baby's organs will be formed. Now that you are pregnant, you will want to do everything you can to have a healthy baby. Two of the most important things are to get good prenatal care and to follow your health care provider's instructions. Prenatal care is all the medical care you receive before the baby's birth. This care will help prevent, find, and treat any problems during the pregnancy and childbirth. Body changes during your first trimester Your body goes through many changes during pregnancy. The changes vary from woman to woman.  You may gain or lose a couple of pounds at first.  You may feel sick to your stomach (nauseous) and you may throw up (vomit). If the vomiting is uncontrollable, call your health care provider.  You may tire easily.  You may develop headaches that can be relieved by medicines. All medicines should be approved by your health care provider.  You may urinate more often. Painful urination may mean you have a bladder infection.  You may develop heartburn as a result of your pregnancy.  You may develop constipation because certain hormones are causing the muscles that push stool through your intestines to slow down.  You may develop hemorrhoids or swollen veins (varicose veins).  Your breasts may begin to grow larger and become tender. Your nipples may stick out more, and the tissue that surrounds them (areola) may become darker.  Your gums may bleed and may be  sensitive to brushing and flossing.  Dark spots or blotches (chloasma, mask of pregnancy) may develop on your face. This will likely fade after the baby is born.  Your menstrual periods will stop.  You may have a loss of appetite.  You may develop cravings for certain kinds of food.  You may have changes in your emotions from day to day, such as being excited to be pregnant or being concerned that something may go wrong with the pregnancy and baby.  You may have more vivid and strange dreams.  You may have changes in your hair. These can include thickening of your hair, rapid growth, and changes in texture. Some women also have hair loss during or after pregnancy, or hair that feels dry or thin. Your hair will most likely return to normal after your baby is born. What to expect at prenatal visits During a routine prenatal visit:  You will be weighed to make sure you and the baby are growing normally.  Your blood pressure will be taken.  Your abdomen will be measured to track your baby's growth.  The fetal heartbeat will be listened to between weeks 10 and 14 of your pregnancy.  Test results from any previous visits will be discussed. Your health care provider may ask you:  How you are feeling.  If you are feeling the baby move.  If you have had any abnormal symptoms, such as leaking fluid, bleeding, severe headaches, or  abdominal cramping.  If you are using any tobacco products, including cigarettes, chewing tobacco, and electronic cigarettes.  If you have any questions. Other tests that may be performed during your first trimester include:  Blood tests to find your blood type and to check for the presence of any previous infections. The tests will also be used to check for low iron levels (anemia) and protein on red blood cells (Rh antibodies). Depending on your risk factors, or if you previously had diabetes during pregnancy, you may have tests to check for high blood sugar  that affects pregnant women (gestational diabetes).  Urine tests to check for infections, diabetes, or protein in the urine.  An ultrasound to confirm the proper growth and development of the baby.  Fetal screens for spinal cord problems (spina bifida) and Down syndrome.  HIV (human immunodeficiency virus) testing. Routine prenatal testing includes screening for HIV, unless you choose not to have this test.  You may need other tests to make sure you and the baby are doing well. Follow these instructions at home: Medicines  Follow your health care provider's instructions regarding medicine use. Specific medicines may be either safe or unsafe to take during pregnancy.  Take a prenatal vitamin that contains at least 600 micrograms (mcg) of folic acid.  If you develop constipation, try taking a stool softener if your health care provider approves. Eating and drinking   Eat a balanced diet that includes fresh fruits and vegetables, whole grains, good sources of protein such as meat, eggs, or tofu, and low-fat dairy. Your health care provider will help you determine the amount of weight gain that is right for you.  Avoid raw meat and uncooked cheese. These carry germs that can cause birth defects in the baby.  Eating four or five small meals rather than three large meals a day may help relieve nausea and vomiting. If you start to feel nauseous, eating a few soda crackers can be helpful. Drinking liquids between meals, instead of during meals, also seems to help ease nausea and vomiting.  Limit foods that are high in fat and processed sugars, such as fried and sweet foods.  To prevent constipation: ? Eat foods that are high in fiber, such as fresh fruits and vegetables, whole grains, and beans. ? Drink enough fluid to keep your urine clear or pale yellow. Activity  Exercise only as directed by your health care provider. Most women can continue their usual exercise routine during  pregnancy. Try to exercise for 30 minutes at least 5 days a week. Exercising will help you: ? Control your weight. ? Stay in shape. ? Be prepared for labor and delivery.  Experiencing pain or cramping in the lower abdomen or lower back is a good sign that you should stop exercising. Check with your health care provider before continuing with normal exercises.  Try to avoid standing for long periods of time. Move your legs often if you must stand in one place for a long time.  Avoid heavy lifting.  Wear low-heeled shoes and practice good posture.  You may continue to have sex unless your health care provider tells you not to. Relieving pain and discomfort  Wear a good support bra to relieve breast tenderness.  Take warm sitz baths to soothe any pain or discomfort caused by hemorrhoids. Use hemorrhoid cream if your health care provider approves.  Rest with your legs elevated if you have leg cramps or low back pain.  If you develop varicose veins  in your legs, wear support hose. Elevate your feet for 15 minutes, 3-4 times a day. Limit salt in your diet. °Prenatal care °· Schedule your prenatal visits by the twelfth week of pregnancy. They are usually scheduled monthly at first, then more often in the last 2 months before delivery. °· Write down your questions. Take them to your prenatal visits. °· Keep all your prenatal visits as told by your health care provider. This is important. °Safety °· Wear your seat belt at all times when driving. °· Make a list of emergency phone numbers, including numbers for family, friends, the hospital, and police and fire departments. °General instructions °· Ask your health care provider for a referral to a local prenatal education class. Begin classes no later than the beginning of month 6 of your pregnancy. °· Ask for help if you have counseling or nutritional needs during pregnancy. Your health care provider can offer advice or refer you to specialists for help  with various needs. °· Do not use hot tubs, steam rooms, or saunas. °· Do not douche or use tampons or scented sanitary pads. °· Do not cross your legs for long periods of time. °· Avoid cat litter boxes and soil used by cats. These carry germs that can cause birth defects in the baby and possibly loss of the fetus by miscarriage or stillbirth. °· Avoid all smoking, herbs, alcohol, and medicines not prescribed by your health care provider. Chemicals in these products affect the formation and growth of the baby. °· Do not use any products that contain nicotine or tobacco, such as cigarettes and e-cigarettes. If you need help quitting, ask your health care provider. You may receive counseling support and other resources to help you quit. °· Schedule a dentist appointment. At home, brush your teeth with a soft toothbrush and be gentle when you floss. °Contact a health care provider if: °· You have dizziness. °· You have mild pelvic cramps, pelvic pressure, or nagging pain in the abdominal area. °· You have persistent nausea, vomiting, or diarrhea. °· You have a bad smelling vaginal discharge. °· You have pain when you urinate. °· You notice increased swelling in your face, hands, legs, or ankles. °· You are exposed to fifth disease or chickenpox. °· You are exposed to German measles (rubella) and have never had it. °Get help right away if: °· You have a fever. °· You are leaking fluid from your vagina. °· You have spotting or bleeding from your vagina. °· You have severe abdominal cramping or pain. °· You have rapid weight gain or loss. °· You vomit blood or material that looks like coffee grounds. °· You develop a severe headache. °· You have shortness of breath. °· You have any kind of trauma, such as from a fall or a car accident. °Summary °· The first trimester of pregnancy is from week 1 until the end of week 13 (months 1 through 3). °· Your body goes through many changes during pregnancy. The changes vary from  woman to woman. °· You will have routine prenatal visits. During those visits, your health care provider will examine you, discuss any test results you may have, and talk with you about how you are feeling. °This information is not intended to replace advice given to you by your health care provider. Make sure you discuss any questions you have with your health care provider. °Document Released: 01/05/2001 Document Revised: 12/24/2016 Document Reviewed: 12/24/2015 °[employer] Patient Education © 2020 [employer] ° ° °  Second Trimester of Pregnancy °The second trimester is from week 14 through week 27 (months 4 through 6). The second trimester is often a time when you feel your best. Your body has adjusted to being pregnant, and you begin to feel better physically. Usually, morning sickness has lessened or quit completely, you may have more energy, and you may have an increase in appetite. The second trimester is also a time when the fetus is growing rapidly. At the end of the sixth month, the fetus is about 9 inches long and weighs about 1½ pounds. You will likely begin to feel the baby move (quickening) between 16 and 20 weeks of pregnancy. °Body changes during your second trimester °Your body continues to go through many changes during your second trimester. The changes vary from woman to woman. °· Your weight will continue to increase. You will notice your lower abdomen bulging out. °· You may begin to get stretch marks on your hips, abdomen, and breasts. °· You may develop headaches that can be relieved by medicines. The medicines should be approved by your health care provider. °· You may urinate more often because the fetus is pressing on your bladder. °· You may develop or continue to have heartburn as a result of your pregnancy. °· You may develop constipation because certain hormones are causing the muscles that push waste through your intestines to slow down. °· You may develop hemorrhoids or swollen,  bulging veins (varicose veins). °· You may have back pain. This is caused by: °? Weight gain. °? Pregnancy hormones that are relaxing the joints in your pelvis. °? A shift in weight and the muscles that support your balance. °· Your breasts will continue to grow and they will continue to become tender. °· Your gums may bleed and may be sensitive to brushing and flossing. °· Dark spots or blotches (chloasma, mask of pregnancy) may develop on your face. This will likely fade after the baby is born. °· A dark line from your belly button to the pubic area (linea nigra) may appear. This will likely fade after the baby is born. °· You may have changes in your hair. These can include thickening of your hair, rapid growth, and changes in texture. Some women also have hair loss during or after pregnancy, or hair that feels dry or thin. Your hair will most likely return to normal after your baby is born. °What to expect at prenatal visits °During a routine prenatal visit: °· You will be weighed to make sure you and the fetus are growing normally. °· Your blood pressure will be taken. °· Your abdomen will be measured to track your baby's growth. °· The fetal heartbeat will be listened to. °· Any test results from the previous visit will be discussed. °Your health care provider may ask you: °· How you are feeling. °· If you are feeling the baby move. °· If you have had any abnormal symptoms, such as leaking fluid, bleeding, severe headaches, or abdominal cramping. °· If you are using any tobacco products, including cigarettes, chewing tobacco, and electronic cigarettes. °· If you have any questions. °Other tests that may be performed during your second trimester include: °· Blood tests that check for: °? Low iron levels (anemia). °? High blood sugar that affects pregnant women (gestational diabetes) between 24 and 28 weeks. °? Rh antibodies. This is to check for a protein on red blood cells (Rh factor). °· Urine tests to check  for infections, diabetes, or protein in   the urine. °· An ultrasound to confirm the proper growth and development of the baby. °· An amniocentesis to check for possible genetic problems. °· Fetal screens for spina bifida and Down syndrome. °· HIV (human immunodeficiency virus) testing. Routine prenatal testing includes screening for HIV, unless you choose not to have this test. °Follow these instructions at home: °Medicines °· Follow your health care provider's instructions regarding medicine use. Specific medicines may be either safe or unsafe to take during pregnancy. °· Take a prenatal vitamin that contains at least 600 micrograms (mcg) of folic acid. °· If you develop constipation, try taking a stool softener if your health care provider approves. °Eating and drinking ° °· Eat a balanced diet that includes fresh fruits and vegetables, whole grains, good sources of protein such as meat, eggs, or tofu, and low-fat dairy. Your health care provider will help you determine the amount of weight gain that is right for you. °· Avoid raw meat and uncooked cheese. These carry germs that can cause birth defects in the baby. °· If you have low calcium intake from food, talk to your health care provider about whether you should take a daily calcium supplement. °· Limit foods that are high in fat and processed sugars, such as fried and sweet foods. °· To prevent constipation: °? Drink enough fluid to keep your urine clear or pale yellow. °? Eat foods that are high in fiber, such as fresh fruits and vegetables, whole grains, and beans. °Activity °· Exercise only as directed by your health care provider. Most women can continue their usual exercise routine during pregnancy. Try to exercise for 30 minutes at least 5 days a week. Stop exercising if you experience uterine contractions. °· Avoid heavy lifting, wear low heel shoes, and practice good posture. °· A sexual relationship may be continued unless your health care provider  directs you otherwise. °Relieving pain and discomfort °· Wear a good support bra to prevent discomfort from breast tenderness. °· Take warm sitz baths to soothe any pain or discomfort caused by hemorrhoids. Use hemorrhoid cream if your health care provider approves. °· Rest with your legs elevated if you have leg cramps or low back pain. °· If you develop varicose veins, wear support hose. Elevate your feet for 15 minutes, 3-4 times a day. Limit salt in your diet. °Prenatal Care °· Write down your questions. Take them to your prenatal visits. °· Keep all your prenatal visits as told by your health care provider. This is important. °Safety °· Wear your seat belt at all times when driving. °· Make a list of emergency phone numbers, including numbers for family, friends, the hospital, and police and fire departments. °General instructions °· Ask your health care provider for a referral to a local prenatal education class. Begin classes no later than the beginning of month 6 of your pregnancy. °· Ask for help if you have counseling or nutritional needs during pregnancy. Your health care provider can offer advice or refer you to specialists for help with various needs. °· Do not use hot tubs, steam rooms, or saunas. °· Do not douche or use tampons or scented sanitary pads. °· Do not cross your legs for long periods of time. °· Avoid cat litter boxes and soil used by cats. These carry germs that can cause birth defects in the baby and possibly loss of the fetus by miscarriage or stillbirth. °· Avoid all smoking, herbs, alcohol, and unprescribed drugs. Chemicals in these products can affect the formation   and growth of the baby. °· Do not use any products that contain nicotine or tobacco, such as cigarettes and e-cigarettes. If you need help quitting, ask your health care provider. °· Visit your dentist if you have not gone yet during your pregnancy. Use a soft toothbrush to brush your teeth and be gentle when you  floss. °Contact a health care provider if: °· You have dizziness. °· You have mild pelvic cramps, pelvic pressure, or nagging pain in the abdominal area. °· You have persistent nausea, vomiting, or diarrhea. °· You have a bad smelling vaginal discharge. °· You have pain when you urinate. °Get help right away if: °· You have a fever. °· You are leaking fluid from your vagina. °· You have spotting or bleeding from your vagina. °· You have severe abdominal cramping or pain. °· You have rapid weight gain or weight loss. °· You have shortness of breath with chest pain. °· You notice sudden or extreme swelling of your face, hands, ankles, feet, or legs. °· You have not felt your baby move in over an hour. °· You have severe headaches that do not go away when you take medicine. °· You have vision changes. °Summary °· The second trimester is from week 14 through week 27 (months 4 through 6). It is also a time when the fetus is growing rapidly. °· Your body goes through many changes during pregnancy. The changes vary from woman to woman. °· Avoid all smoking, herbs, alcohol, and unprescribed drugs. These chemicals affect the formation and growth your baby. °· Do not use any tobacco products, such as cigarettes, chewing tobacco, and e-cigarettes. If you need help quitting, ask your health care provider. °· Contact your health care provider if you have any questions. Keep all prenatal visits as told by your health care provider. This is important. °This information is not intended to replace advice given to you by your health care provider. Make sure you discuss any questions you have with your health care provider. °Document Released: 01/05/2001 Document Revised: 05/05/2018 Document Reviewed: 02/17/2016 °[employer]vier Patient Education © 2020 [employer] ° ° °Contraception Choices °Contraception, also called birth control, refers to methods or devices that prevent pregnancy. °Hormonal methods °Contraceptive implant ° °A  contraceptive implant is a thin, plastic tube that contains a hormone. It is inserted into the upper part of the arm. It can remain in place for up to 3 years. °Progestin-only injections °Progestin-only injections are injections of progestin, a synthetic form of the hormone progesterone. They are given every 3 months by a health care provider. °Birth control pills ° °Birth control pills are pills that contain hormones that prevent pregnancy. They must be taken once a day, preferably at the same time each day. °Birth control patch ° °The birth control patch contains hormones that prevent pregnancy. It is placed on the skin and must be changed once a week for three weeks and removed on the fourth week. A prescription is needed to use this method of contraception. °Vaginal ring ° °A vaginal ring contains hormones that prevent pregnancy. It is placed in the vagina for three weeks and removed on the fourth week. After that, the process is repeated with a new ring. A prescription is needed to use this method of contraception. °Emergency contraceptive °Emergency contraceptives prevent pregnancy after unprotected sex. They come in pill form and can be taken up to 5 days after sex. They work best the sooner they are taken after having sex. Most emergency contraceptives   are available without a prescription. This method should not be used as your only form of birth control. °Barrier methods °Female condom ° °A female condom is a thin sheath that is worn over the penis during sex. Condoms keep sperm from going inside a woman's body. They can be used with a spermicide to increase their effectiveness. They should be disposed after a single use. °Female condom ° °A female condom is a soft, loose-fitting sheath that is put into the vagina before sex. The condom keeps sperm from going inside a woman's body. They should be disposed after a single use. °Diaphragm ° °A diaphragm is a soft, dome-shaped barrier. It is inserted into the  vagina before sex, along with a spermicide. The diaphragm blocks sperm from entering the uterus, and the spermicide kills sperm. A diaphragm should be left in the vagina for 6-8 hours after sex and removed within 24 hours. °A diaphragm is prescribed and fitted by a health care provider. A diaphragm should be replaced every 1-2 years, after giving birth, after gaining more than 15 lb (6.8 kg), and after pelvic surgery. °Cervical cap ° °A cervical cap is a round, soft latex or plastic cup that fits over the cervix. It is inserted into the vagina before sex, along with spermicide. It blocks sperm from entering the uterus. The cap should be left in place for 6-8 hours after sex and removed within 48 hours. A cervical cap must be prescribed and fitted by a health care provider. It should be replaced every 2 years. °Sponge ° °A sponge is a soft, circular piece of polyurethane foam with spermicide on it. The sponge helps block sperm from entering the uterus, and the spermicide kills sperm. To use it, you make it wet and then insert it into the vagina. It should be inserted before sex, left in for at least 6 hours after sex, and removed and thrown away within 30 hours. °Spermicides °Spermicides are chemicals that kill or block sperm from entering the cervix and uterus. They can come as a cream, jelly, suppository, foam, or tablet. A spermicide should be inserted into the vagina with an applicator at least 10-15 minutes before sex to allow time for it to work. The process must be repeated every time you have sex. Spermicides do not require a prescription. °Intrauterine contraception °Intrauterine device (IUD) °An IUD is a T-shaped device that is put in a woman's uterus. There are two types: °· Hormone IUD.This type contains progestin, a synthetic form of the hormone progesterone. This type can stay in place for 3-5 years. °· Copper IUD.This type is wrapped in copper wire. It can stay in place for 10 years. ° °Permanent  methods of contraception °Female tubal ligation °In this method, a woman's fallopian tubes are sealed, tied, or blocked during surgery to prevent eggs from traveling to the uterus. °Hysteroscopic sterilization °In this method, a small, flexible insert is placed into each fallopian tube. The inserts cause scar tissue to form in the fallopian tubes and block them, so sperm cannot reach an egg. The procedure takes about 3 months to be effective. Another form of birth control must be used during those 3 months. °Female sterilization °This is a procedure to tie off the tubes that carry sperm (vasectomy). After the procedure, the man can still ejaculate fluid (semen). °Natural planning methods °Natural family planning °In this method, a couple does not have sex on days when the woman could become pregnant. °Calendar method °This means keeping track of   the length of each menstrual cycle, identifying the days when pregnancy can happen, and not having sex on those days. °Ovulation method °In this method, a couple avoids sex during ovulation. °Symptothermal method °This method involves not having sex during ovulation. The woman typically checks for ovulation by watching changes in her temperature and in the consistency of cervical mucus. °Post-ovulation method °In this method, a couple waits to have sex until after ovulation. °Summary °· Contraception, also called birth control, means methods or devices that prevent pregnancy. °· Hormonal methods of contraception include implants, injections, pills, patches, vaginal rings, and emergency contraceptives. °· Barrier methods of contraception can include female condoms, female condoms, diaphragms, cervical caps, sponges, and spermicides. °· There are two types of IUDs (intrauterine devices). An IUD can be put in a woman's uterus to prevent pregnancy for 3-5 years. °· Permanent sterilization can be done through a procedure for males, females, or both. °· Natural family planning methods  involve not having sex on days when the woman could become pregnant. °This information is not intended to replace advice given to you by your health care provider. Make sure you discuss any questions you have with your health care provider. °Document Released: 01/11/2005 Document Revised: 01/13/2017 Document Reviewed: 02/14/2016 °Elsevier Patient Education © 2020 [employer] ° ° °Breastfeeding ° °Choosing to breastfeed is one of the best decisions you can make for yourself and your baby. A change in hormones during pregnancy causes your breasts to make breast milk in your milk-producing glands. Hormones prevent breast milk from being released before your baby is born. They also prompt milk flow after birth. Once breastfeeding has begun, thoughts of your baby, as well as his or her sucking or crying, can stimulate the release of milk from your milk-producing glands. °Benefits of breastfeeding °Research shows that breastfeeding offers many health benefits for infants and mothers. It also offers a cost-free and convenient way to feed your baby. °For your baby °· Your first milk (colostrum) helps your baby's digestive system to function better. °· Special cells in your milk (antibodies) help your baby to fight off infections. °· Breastfed babies are less likely to develop asthma, allergies, obesity, or type 2 diabetes. They are also at lower risk for sudden infant death syndrome (SIDS). °· Nutrients in breast milk are better able to meet your baby’s needs compared to infant formula. °· Breast milk improves your baby's brain development. °For you °· Breastfeeding helps to create a very special bond between you and your baby. °· Breastfeeding is convenient. Breast milk costs nothing and is always available at the correct temperature. °· Breastfeeding helps to burn calories. It helps you to lose the weight that you gained during pregnancy. °· Breastfeeding makes your uterus return faster to its size before pregnancy. It  also slows bleeding (lochia) after you give birth. °· Breastfeeding helps to lower your risk of developing type 2 diabetes, osteoporosis, rheumatoid arthritis, cardiovascular disease, and breast, ovarian, uterine, and endometrial cancer later in life. °Breastfeeding basics °Starting breastfeeding °· Find a comfortable place to sit or lie down, with your neck and back well-supported. °· Place a pillow or a rolled-up blanket under your baby to bring him or her to the level of your breast (if you are seated). Nursing pillows are specially designed to help support your arms and your baby while you breastfeed. °· Make sure that your baby's tummy (abdomen) is facing your abdomen. °· Gently massage your breast. With your fingertips, massage from the outer edges   of your breast inward toward the nipple. This encourages milk flow. If your milk flows slowly, you may need to continue this action during the feeding. °· Support your breast with 4 fingers underneath and your thumb above your nipple (make the letter "C" with your hand). Make sure your fingers are well away from your nipple and your baby’s mouth. °· Stroke your baby's lips gently with your finger or nipple. °· When your baby's mouth is open wide enough, quickly bring your baby to your breast, placing your entire nipple and as much of the areola as possible into your baby's mouth. The areola is the colored area around your nipple. °? More areola should be visible above your baby's upper lip than below the lower lip. °? Your baby's lips should be opened and extended outward (flanged) to ensure an adequate, comfortable latch. °? Your baby's tongue should be between his or her lower gum and your breast. °· Make sure that your baby's mouth is correctly positioned around your nipple (latched). Your baby's lips should create a seal on your breast and be turned out (everted). °· It is common for your baby to suck about 2-3 minutes in order to start the flow of breast  milk. °Latching °Teaching your baby how to latch onto your breast properly is very important. An improper latch can cause nipple pain, decreased milk supply, and poor weight gain in your baby. Also, if your baby is not latched onto your nipple properly, he or she may swallow some air during feeding. This can make your baby fussy. Burping your baby when you switch breasts during the feeding can help to get rid of the air. However, teaching your baby to latch on properly is still the best way to prevent fussiness from swallowing air while breastfeeding. °Signs that your baby has successfully latched onto your nipple °· Silent tugging or silent sucking, without causing you pain. Infant's lips should be extended outward (flanged). °· Swallowing heard between every 3-4 sucks once your milk has started to flow (after your let-down milk reflex occurs). °· Muscle movement above and in front of his or her ears while sucking. °Signs that your baby has not successfully latched onto your nipple °· Sucking sounds or smacking sounds from your baby while breastfeeding. °· Nipple pain. °If you think your baby has not latched on correctly, slip your finger into the corner of your baby’s mouth to break the suction and place it between your baby's gums. Attempt to start breastfeeding again. °Signs of successful breastfeeding °Signs from your baby °· Your baby will gradually decrease the number of sucks or will completely stop sucking. °· Your baby will fall asleep. °· Your baby's body will relax. °· Your baby will retain a small amount of milk in his or her mouth. °· Your baby will let go of your breast by himself or herself. °Signs from you °· Breasts that have increased in firmness, weight, and size 1-3 hours after feeding. °· Breasts that are softer immediately after breastfeeding. °· Increased milk volume, as well as a change in milk consistency and color by the fifth day of breastfeeding. °· Nipples that are not sore, cracked, or  bleeding. °Signs that your baby is getting enough milk °· Wetting at least 1-2 diapers during the first 24 hours after birth. °· Wetting at least 5-6 diapers every 24 hours for the first week after birth. The urine should be clear or pale yellow by the age of 5 days. °· Wetting   6-8 diapers every 24 hours as your baby continues to grow and develop. °· At least 3 stools in a 24-hour period by the age of 5 days. The stool should be soft and yellow. °· At least 3 stools in a 24-hour period by the age of 7 days. The stool should be seedy and yellow. °· No loss of weight greater than 10% of birth weight during the first 3 days of life. °· Average weight gain of 4-7 oz (113-198 g) per week after the age of 4 days. °· Consistent daily weight gain by the age of 5 days, without weight loss after the age of 2 weeks. °After a feeding, your baby may spit up a small amount of milk. This is normal. °Breastfeeding frequency and duration °Frequent feeding will help you make more milk and can prevent sore nipples and extremely full breasts (breast engorgement). Breastfeed when you feel the need to reduce the fullness of your breasts or when your baby shows signs of hunger. This is called "breastfeeding on demand." Signs that your baby is hungry include: °· Increased alertness, activity, or restlessness. °· Movement of the head from side to side. °· Opening of the mouth when the corner of the mouth or cheek is stroked (rooting). °· Increased sucking sounds, smacking lips, cooing, sighing, or squeaking. °· Hand-to-mouth movements and sucking on fingers or hands. °· Fussing or crying. °Avoid introducing a pacifier to your baby in the first 4-6 weeks after your baby is born. After this time, you may choose to use a pacifier. Research has shown that pacifier use during the first year of a baby's life decreases the risk of sudden infant death syndrome (SIDS). °Allow your baby to feed on each breast as long as he or she wants. When your  baby unlatches or falls asleep while feeding from the first breast, offer the second breast. Because newborns are often sleepy in the first few weeks of life, you may need to awaken your baby to get him or her to feed. °Breastfeeding times will vary from baby to baby. However, the following rules can serve as a guide to help you make sure that your baby is properly fed: °· Newborns (babies 4 weeks of age or younger) may breastfeed every 1-3 hours. °· Newborns should not go without breastfeeding for longer than 3 hours during the day or 5 hours during the night. °· You should breastfeed your baby a minimum of 8 times in a 24-hour period. °Breast milk pumping ° °  ° °Pumping and storing breast milk allows you to make sure that your baby is exclusively fed your breast milk, even at times when you are unable to breastfeed. This is especially important if you go back to work while you are still breastfeeding, or if you are not able to be present during feedings. Your lactation consultant can help you find a method of pumping that works best for you and give you guidelines about how long it is safe to store breast milk. °Caring for your breasts while you breastfeed °Nipples can become dry, cracked, and sore while breastfeeding. The following recommendations can help keep your breasts moisturized and healthy: °· Avoid using soap on your nipples. °· Wear a supportive bra designed especially for nursing. Avoid wearing underwire-style bras or extremely tight bras (sports bras). °· Air-dry your nipples for 3-4 minutes after each feeding. °· Use only cotton bra pads to absorb leaked breast milk. Leaking of breast milk between feedings is normal. °·   Use lanolin on your nipples after breastfeeding. Lanolin helps to maintain your skin's normal moisture barrier. Pure lanolin is not harmful (not toxic) to your baby. You may also hand express a few drops of breast milk and gently massage that milk into your nipples and allow the milk  to air-dry. °In the first few weeks after giving birth, some women experience breast engorgement. Engorgement can make your breasts feel heavy, warm, and tender to the touch. Engorgement peaks within 3-5 days after you give birth. The following recommendations can help to ease engorgement: °· Completely empty your breasts while breastfeeding or pumping. You may want to start by applying warm, moist heat (in the shower or with warm, water-soaked hand towels) just before feeding or pumping. This increases circulation and helps the milk flow. If your baby does not completely empty your breasts while breastfeeding, pump any extra milk after he or she is finished. °· Apply ice packs to your breasts immediately after breastfeeding or pumping, unless this is too uncomfortable for you. To do this: °? Put ice in a plastic bag. °? Place a towel between your skin and the bag. °? Leave the ice on for 20 minutes, 2-3 times a day. °· Make sure that your baby is latched on and positioned properly while breastfeeding. °If engorgement persists after 48 hours of following these recommendations, contact your health care provider or a lactation consultant. °Overall health care recommendations while breastfeeding °· Eat 3 healthy meals and 3 snacks every day. Well-nourished mothers who are breastfeeding need an additional 450-500 calories a day. You can meet this requirement by increasing the amount of a balanced diet that you eat. °· Drink enough water to keep your urine pale yellow or clear. °· Rest often, relax, and continue to take your prenatal vitamins to prevent fatigue, stress, and low vitamin and mineral levels in your body (nutrient deficiencies). °· Do not use any products that contain nicotine or tobacco, such as cigarettes and e-cigarettes. Your baby may be harmed by chemicals from cigarettes that pass into breast milk and exposure to secondhand smoke. If you need help quitting, ask your health care provider. °· Avoid  alcohol. °· Do not use illegal drugs or marijuana. °· Talk with your health care provider before taking any medicines. These include over-the-counter and prescription medicines as well as vitamins and herbal supplements. Some medicines that may be harmful to your baby can pass through breast milk. °· It is possible to become pregnant while breastfeeding. If birth control is desired, ask your health care provider about options that will be safe while breastfeeding your baby. °Where to find more information: °[employer]: www.llli.org °Contact a health care provider if: °· You feel like you want to stop breastfeeding or have become frustrated with breastfeeding. °· Your nipples are cracked or bleeding. °· Your breasts are red, tender, or warm. °· You have: °? Painful breasts or nipples. °? A swollen area on either breast. °? A fever or chills. °? Nausea or vomiting. °? Drainage other than breast milk from your nipples. °· Your breasts do not become full before feedings by the fifth day after you give birth. °· You feel sad and depressed. °· Your baby is: °? Too sleepy to eat well. °? Having trouble sleeping. °? More than 1 week old and wetting fewer than 6 diapers in a 24-hour period. °? Not gaining weight by 5 days of age. °· Your baby has fewer than 3 stools in a 24-hour period. °·   Your baby's skin or the white parts of his or her eyes become yellow. °Get help right away if: °· Your baby is overly tired (lethargic) and does not want to wake up and feed. °· Your baby develops an unexplained fever. °Summary °· Breastfeeding offers many health benefits for infant and mothers. °· Try to breastfeed your infant when he or she shows early signs of hunger. °· Gently tickle or stroke your baby's lips with your finger or nipple to allow the baby to open his or her mouth. Bring the baby to your breast. Make sure that much of the areola is in your baby's mouth. Offer one side and burp the baby before you offer  the other side. °· Talk with your health care provider or lactation consultant if you have questions or you face problems as you breastfeed. °This information is not intended to replace advice given to you by your health care provider. Make sure you discuss any questions you have with your health care provider. °Document Released: 01/11/2005 Document Revised: 04/07/2017 Document Reviewed: 02/13/2016 °Elsevier Patient Education © 2020 [employer]. ° °

## 2018-11-06 NOTE — Progress Notes (Signed)
Subjective:    Catherine Houston is a [id number] Unknown being seen today for her first obstetrical visit.  Her obstetrical history is significant for advanced maternal age and previous pregnancy with GDM on glyburide. Patient does intend to breast feed. Pregnancy history fully reviewed.  Patient reports some vaginal bleeding with the passage of a clot a month ago. She also reports taking metformin since 2018 as a "preventative" measure per her PCP.  Vitals:   11/06/18 0822  BP: 112/75  Pulse: 60  Weight: 179 lb 1.6 oz (81.2 kg)    HISTORY: OB History  Gravida Para Term Preterm AB Living  3 2 2     2   SAB TAB Ectopic Multiple Live Births        0 2    # Outcome Date GA Lbr Len/2nd Weight Sex Delivery Anes PTL Lv  3 Current           2 Term 09/14/16 [redacted]w[redacted]d 04:20 / 00:16 7 lb 11.8 oz (3.51 kg) F Vag-Spont EPI  LIV  1 Term 08/26/99     Vag-Spont   LIV     Birth Comments: Baby diagnosed with chromosomal deletion r/t autism, epilepsy    Obstetric Comments  8lbs 11oz.    Past Medical History:  Diagnosis Date  . Genital HSV    HSV1  . Gestational diabetes   . UTI (urinary tract infection)    2015   Past Surgical History:  Procedure Laterality Date  . NO PAST SURGERIES     Family History  Problem Relation Age of Onset  . Diabetes Mother   . Hypertension Mother   . Diabetes Father      Exam    Uterus:   8-weeks size  Pelvic Exam:    Perineum: No Hemorrhoids, Normal Perineum   Vulva: normal   Vagina:  normal mucosa, normal discharge   pH:    Cervix: multiparous appearance   Adnexa: normal adnexa and no mass, fullness, tenderness   Bony Pelvis: gynecoid  System: Breast:  normal appearance, no masses or tenderness   Skin: normal coloration and turgor, no rashes    Neurologic: oriented, no focal deficits   Extremities: normal strength, tone, and muscle mass   HEENT extra ocular movement intact   Mouth/Teeth mucous membranes moist, pharynx normal without lesions and dental  hygiene good   Neck supple and no masses   Cardiovascular: regular rate and rhythm   Respiratory:  appears well, vitals normal, no respiratory distress, acyanotic, normal RR, chest clear, no wheezing, crepitations, rhonchi, normal symmetric air entry   Abdomen: soft, non-tender; bowel sounds normal; no masses,  no organomegaly   Urinary:    Pt informed that the ultrasound is considered a limited OB ultrasound and is not intended to be a complete ultrasound exam.  Patient also informed that the ultrasound is not being completed with the intent of assessing for fetal or placental anomalies or any pelvic abnormalities.  Explained that the purpose of today's ultrasound is to assess for  viability.  Patient acknowledges the purpose of the exam and the limitations of the study.  - transabdominal ultrasound reveals the presence of an Intrauterine gestational sac without clear fetal pole and questionable fetal cardiac movement     Assessment:    Pregnancy: [id number] Patient Active Problem List   Diagnosis Date Noted  . Supervision of other normal pregnancy, antepartum 11/06/2018  . History of gestational diabetes mellitus (GDM) 11/06/2018  . Supervision of high risk pregnancy, antepartum  11/06/2018  . 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 BMI 31.0-31.9,adult 03/23/2016  . Hereditary disease in family possibly affecting fetus, affecting management of mother, antepartum condition or complication, not applicable or unspecified fetus         Plan:     Initial labs deferred pending confirmation of viable pregnancy. Prenatal vitamins. Problem list reviewed and updated.  Ultrasound discussed; viability scan ordered  Follow up in 4 weeks. 50% of 30 min visit spent on counseling and coordination of care.     Marzell Allemand 11/06/2018

## 2018-11-06 NOTE — Telephone Encounter (Signed)
Patient called stating that she would like some test strips and lancets called in. She would like to test her blood sugars after Dr. [name] has stopped her metformin this morning at her visit.   Per Dr. [name] ok'd. Kathrene Alu RN

## 2018-11-07 ENCOUNTER — Other Ambulatory Visit: Payer: Self-pay

## 2018-11-07 DIAGNOSIS — Z3480 Encounter for supervision of other normal pregnancy, unspecified trimester: Secondary | ICD-10-CM

## 2018-11-07 DIAGNOSIS — O24410 Gestational diabetes mellitus in pregnancy, diet controlled: Secondary | ICD-10-CM

## 2018-11-07 MED ORDER — ACCU-CHEK GUIDE VI STRP: 1.0000 | ORAL_STRIP | Freq: Four times a day (QID) | 12 refills | Status: DC

## 2018-11-07 NOTE — Progress Notes (Signed)
Lab order put in

## 2018-11-09 ENCOUNTER — Other Ambulatory Visit: Payer: Self-pay

## 2018-11-09 ENCOUNTER — Ambulatory Visit (HOSPITAL_COMMUNITY)
Admission: RE | Admit: 2018-11-09 | Discharge: 2018-11-09 | Disposition: A | Discharge status: Home / Self Care | Payer: Medicaid Other | Source: Ambulatory Visit | Attending: Obstetrics and Gynecology | Admitting: Obstetrics and Gynecology

## 2018-11-09 DIAGNOSIS — Z3480 Encounter for supervision of other normal pregnancy, unspecified trimester: Secondary | ICD-10-CM | POA: Insufficient documentation

## 2018-11-10 ENCOUNTER — Telehealth: Payer: Self-pay

## 2018-11-10 LAB — CERVICOVAGINAL ANCILLARY ONLY
Chlamydia: NEGATIVE
Comment: NEGATIVE
Comment: NORMAL
Neisseria Gonorrhea: NEGATIVE

## 2018-11-10 NOTE — Telephone Encounter (Signed)
Patient called back and scheduled for lab visit next week. Kathrene Alu RN

## 2018-11-10 NOTE — Telephone Encounter (Signed)
-----   Message from Mora Bellman, MD sent at 11/10/2018  7:59 AM EDT ----- Please have the patient come in to have her new ob labs drawn before 12/04/18, including an A1C

## 2018-11-10 NOTE — Telephone Encounter (Signed)
Left message for patient to return call to office. Jennifer Howard  RN 

## 2018-11-14 ENCOUNTER — Other Ambulatory Visit: Payer: Medicaid Other

## 2018-11-14 ENCOUNTER — Other Ambulatory Visit: Payer: Self-pay

## 2018-11-14 DIAGNOSIS — O0990 Supervision of high risk pregnancy, unspecified, unspecified trimester: Secondary | ICD-10-CM

## 2018-11-14 DIAGNOSIS — Z3480 Encounter for supervision of other normal pregnancy, unspecified trimester: Secondary | ICD-10-CM

## 2018-11-15 LAB — CYTOLOGY - PAP
Comment: NEGATIVE
Diagnosis: NEGATIVE
High risk HPV: NEGATIVE

## 2018-11-16 LAB — HEMOGLOBIN A1C
Est. average glucose Bld gHb Est-mCnc: 128 mg/dL
Hgb A1c MFr Bld: 6.1 % — ABNORMAL HIGH (ref 4.8–5.6)

## 2018-11-16 LAB — OBSTETRIC PANEL, INCLUDING HIV
Antibody Screen: NEGATIVE
Basophils Absolute: 0 10*3/uL (ref 0.0–0.2)
Basos: 0 %
EOS (ABSOLUTE): 0.1 10*3/uL (ref 0.0–0.4)
Eos: 1 %
HIV Screen 4th Generation wRfx: NONREACTIVE
Hematocrit: 33.3 % — ABNORMAL LOW (ref 34.0–46.6)
Hemoglobin: 11.4 g/dL (ref 11.1–15.9)
Hepatitis B Surface Ag: NEGATIVE
Immature Grans (Abs): 0 10*3/uL (ref 0.0–0.1)
Immature Granulocytes: 0 %
Lymphocytes Absolute: 2 10*3/uL (ref 0.7–3.1)
Lymphs: 26 %
MCH: 28.5 pg (ref 26.6–33.0)
MCHC: 34.2 g/dL (ref 31.5–35.7)
MCV: 83 fL (ref 79–97)
Monocytes Absolute: 0.5 10*3/uL (ref 0.1–0.9)
Monocytes: 7 %
Neutrophils Absolute: 5 10*3/uL (ref 1.4–7.0)
Neutrophils: 66 %
Platelets: 323 10*3/uL (ref 150–450)
RBC: 4 x10E6/uL (ref 3.77–5.28)
RDW: 13.1 % (ref 11.7–15.4)
RPR Ser Ql: NONREACTIVE
Rh Factor: POSITIVE
Rubella Antibodies, IGG: 1.68 index (ref >0.99)
WBC: 7.7 10*3/uL (ref 3.4–10.8)

## 2018-11-16 LAB — HEMOGLOBINOPATHY EVALUATION
HGB C: 0 %
HGB S: 0 %
HGB VARIANT: 0 %
Hemoglobin A2 Quantitation: 2.5 % (ref 1.8–3.2)
Hemoglobin F Quantitation: 0.9 % (ref 0.0–2.0)
Hgb A: 96.6 % (ref 96.4–98.8)

## 2018-11-17 LAB — CULTURE, OB URINE

## 2018-11-17 LAB — URINE CULTURE, OB REFLEX

## 2018-11-22 ENCOUNTER — Other Ambulatory Visit: Payer: Self-pay

## 2018-11-22 ENCOUNTER — Other Ambulatory Visit: Payer: Medicaid Other

## 2018-11-22 DIAGNOSIS — Z3480 Encounter for supervision of other normal pregnancy, unspecified trimester: Secondary | ICD-10-CM

## 2018-11-23 ENCOUNTER — Other Ambulatory Visit: Payer: Self-pay | Admitting: Obstetrics and Gynecology

## 2018-11-23 DIAGNOSIS — O24419 Gestational diabetes mellitus in pregnancy, unspecified control: Secondary | ICD-10-CM

## 2018-11-23 LAB — GLUCOSE TOLERANCE, 2 HOURS W/ 1HR
Glucose, 1 hour: 230 mg/dL — ABNORMAL HIGH (ref 65–179)
Glucose, 2 hour: 222 mg/dL — ABNORMAL HIGH (ref 65–152)
Glucose, Fasting: 109 mg/dL — ABNORMAL HIGH (ref 65–91)

## 2018-11-23 MED ORDER — ASPIRIN EC 81 MG PO TBEC: 81.0000 mg | DELAYED_RELEASE_TABLET | Freq: Every day | ORAL | 2 refills | Status: DC

## 2018-11-27 ENCOUNTER — Telehealth: Payer: Self-pay

## 2018-11-27 ENCOUNTER — Encounter: Payer: Self-pay | Admitting: Obstetrics and Gynecology

## 2018-11-27 NOTE — Telephone Encounter (Signed)
  Patient notified, but she does not want to go to [facility] and has cancelled her appt. She has an appt on 11/30/18 to see Dr. Rip Harbour

## 2018-11-27 NOTE — Telephone Encounter (Signed)
-----   Message from Mora Bellman, MD sent at 11/27/2018 10:26 AM EST ----- Regarding: RE: change rx Contact: [phone] I would like to see her CBGs first. We may need to increase her metformin because she is on a low dose. The next step is insulin not glyburide. We can talk about it some more at her upcoming appointment.  I don't see that she is scheduled to meet with a diabetic educator and nutritionist. Can you follow up on that please?  Metformin in not known for causing headaches. She can be having headaches related to pregnancy. I would encourage her to remain well hydrated and to take tylenol as needed for her headaches.  Thanks  [name] ----- Message ----- From: Tamela Oddi, RMA Sent: 11/27/2018   8:43 AM EST To: Mora Bellman, MD Subject: change rx                                      Hi Dr. [name],  Catherine Houston left a message saying that the Metformin is giving her headaches and her numbers are not going down.  She is requesting that you change the Rx to Glyburide as that was what worked with her pervious pregnancy.

## 2018-11-30 ENCOUNTER — Encounter: Payer: Self-pay | Admitting: Obstetrics and Gynecology

## 2018-11-30 ENCOUNTER — Other Ambulatory Visit: Payer: Self-pay

## 2018-11-30 ENCOUNTER — Ambulatory Visit (INDEPENDENT_AMBULATORY_CARE_PROVIDER_SITE_OTHER): Payer: Medicaid Other | Admitting: Obstetrics and Gynecology

## 2018-11-30 VITALS — BP 108/69 | HR 81 | Wt 177.8 lb

## 2018-11-30 DIAGNOSIS — O0990 Supervision of high risk pregnancy, unspecified, unspecified trimester: Secondary | ICD-10-CM

## 2018-11-30 DIAGNOSIS — O352XX0 Maternal care for (suspected) hereditary disease in fetus, not applicable or unspecified: Secondary | ICD-10-CM

## 2018-11-30 DIAGNOSIS — O98812 Other maternal infectious and parasitic diseases complicating pregnancy, second trimester: Secondary | ICD-10-CM

## 2018-11-30 DIAGNOSIS — O0992 Supervision of high risk pregnancy, unspecified, second trimester: Secondary | ICD-10-CM

## 2018-11-30 DIAGNOSIS — O24419 Gestational diabetes mellitus in pregnancy, unspecified control: Secondary | ICD-10-CM

## 2018-11-30 DIAGNOSIS — B009 Herpesviral infection, unspecified: Secondary | ICD-10-CM

## 2018-11-30 DIAGNOSIS — Z3A14 14 weeks gestation of pregnancy: Secondary | ICD-10-CM

## 2018-11-30 MED ORDER — GLYBURIDE 2.5 MG PO TABS: 2.5000 mg | ORAL_TABLET | Freq: Every day | ORAL | 2 refills | Status: DC

## 2018-11-30 NOTE — Progress Notes (Signed)
Subjective:  Catherine Houston is a 38 y.o. G3P2002 at 33w3dbeing seen today for ongoing prenatal care.  She is currently monitored for the following issues for this high-risk pregnancy and has Hereditary disease in family possibly affecting fetus, affecting management of mother, antepartum condition or complication, not applicable or unspecified fetus; History of genital HSV-1 infection; BMI 31.0-31.9,adult; Gestational diabetes mellitus (GDM) affecting pregnancy, antepartum; History of gestational diabetes mellitus (GDM); and Supervision of high risk pregnancy, antepartum on their problem list.  Patient reports no complaints.  Contractions: Not present. Vag. Bleeding: None.   . Denies leaking of fluid.   The following portions of the patient's history were reviewed and updated as appropriate: allergies, current medications, past family history, past medical history, past social history, past surgical history and problem list. Problem list updated.  Objective:   Vitals:   11/30/18 0914  BP: 108/69  Pulse: 81  Weight: 177 lb 12.8 oz (80.6 kg)    Fetal Status: Fetal Heart Rate (bpm): 155         General:  Alert, oriented and cooperative. Patient is in no acute distress.  Skin: Skin is warm and dry. No rash noted.   Cardiovascular: Normal heart rate noted  Respiratory: Normal respiratory effort, no problems with respiration noted  Abdomen: Soft, gravid, appropriate for gestational age. Pain/Pressure: Absent     Pelvic:  Cervical exam deferred        Extremities: Normal range of motion.  Edema: None  Mental Status: Normal mood and affect. Normal behavior. Normal judgment and thought content.   Urinalysis:      Assessment and Plan:  Pregnancy: G3P2002 at 157w3d1. Supervision of high risk pregnancy, antepartum Stable  2. Gestational diabetes mellitus (GDM) affecting pregnancy, antepartum Pt was unable to tolerate the Metformin, caused HA's and fasting CBG's were still elevated. 2 hr PP are  in goal range Pt was on Glyburide with last pregnancy. Tolerated and good CBG control. She had some left over and has taken a bedtime with fasting CBG's in goal range Discussed with pt that Glyburide is not used now in pregnancy. Discussed low dose NPH at bedtime for fasting CBG control. Pt declined but would be willing to start insulin if the Glyburide did not work. I think this is reasonable as the most important issue is CBG control to reduce pregnancy risks and DM. - Comp Met (CMET) - Protein / creatinine ratio, urine - glyBURIDE (DIABETA) 2.5 MG tablet; Take 1 tablet (2.5 mg total) by mouth at bedtime.  Dispense: 90 tablet; Refill: 2  3. History of genital HSV-1 infection Suppression at 36 weeks  4. Hereditary disease in family possibly affecting fetus, affecting management of mother, antepartum condition or complication, not applicable or unspecified fetus LR NIPs  Preterm labor symptoms and general obstetric precautions including but not limited to vaginal bleeding, contractions, leaking of fluid and fetal movement were reviewed in detail with the patient. Please refer to After Visit Summary for other counseling recommendations.  Return in about 2 weeks (around 12/14/2018) for OB visit, face to face, MD provider .   ErChancy MilroyMD

## 2018-11-30 NOTE — Progress Notes (Signed)
Pt is here for ROB [redacted]w[redacted]d. GDM. Pt reports that her fasting BG levels have been 105-110. Pt reports that her BG levels after meals have been under 120's. Pt reports that she has been having HA's, she stopped taking the Metformin for 2 days and the headache went away.

## 2018-12-01 LAB — COMPREHENSIVE METABOLIC PANEL
ALT: 12 IU/L (ref 0–32)
AST: 14 IU/L (ref 0–40)
Albumin/Globulin Ratio: 1.4 (ref 1.2–2.2)
Albumin: 3.9 g/dL (ref 3.8–4.8)
Alkaline Phosphatase: 52 IU/L (ref 39–117)
BUN/Creatinine Ratio: 13 (ref 9–23)
BUN: 6 mg/dL (ref 6–20)
Bilirubin Total: 0.3 mg/dL (ref 0.0–1.2)
CO2: 20 mmol/L (ref 20–29)
Calcium: 9.4 mg/dL (ref 8.7–10.2)
Chloride: 103 mmol/L (ref 96–106)
Creatinine, Ser: 0.48 mg/dL — ABNORMAL LOW (ref 0.57–1.00)
GFR calc Af Amer: 145 mL/min/{1.73_m2} (ref >59)
GFR calc non Af Amer: 126 mL/min/{1.73_m2} (ref >59)
Globulin, Total: 2.8 g/dL (ref 1.5–4.5)
Glucose: 135 mg/dL — ABNORMAL HIGH (ref 65–99)
Potassium: 3.7 mmol/L (ref 3.5–5.2)
Sodium: 137 mmol/L (ref 134–144)
Total Protein: 6.7 g/dL (ref 6.0–8.5)

## 2018-12-01 LAB — PROTEIN / CREATININE RATIO, URINE
Creatinine, Urine: 98.7 mg/dL
Protein, Ur: 5.8 mg/dL
Protein/Creat Ratio: 59 mg/g creat (ref 0–200)

## 2018-12-04 ENCOUNTER — Encounter: Payer: Medicaid Other | Admitting: Obstetrics and Gynecology

## 2018-12-13 ENCOUNTER — Encounter: Payer: Medicaid Other | Admitting: Obstetrics & Gynecology

## 2018-12-15 ENCOUNTER — Ambulatory Visit (INDEPENDENT_AMBULATORY_CARE_PROVIDER_SITE_OTHER): Payer: Medicaid Other | Admitting: Obstetrics and Gynecology

## 2018-12-15 ENCOUNTER — Other Ambulatory Visit: Payer: Self-pay

## 2018-12-15 ENCOUNTER — Encounter: Payer: Self-pay | Admitting: Obstetrics and Gynecology

## 2018-12-15 VITALS — BP 107/70 | HR 80 | Wt 177.6 lb

## 2018-12-15 DIAGNOSIS — B009 Herpesviral infection, unspecified: Secondary | ICD-10-CM

## 2018-12-15 DIAGNOSIS — O0990 Supervision of high risk pregnancy, unspecified, unspecified trimester: Secondary | ICD-10-CM

## 2018-12-15 DIAGNOSIS — Z3A16 16 weeks gestation of pregnancy: Secondary | ICD-10-CM

## 2018-12-15 DIAGNOSIS — O24419 Gestational diabetes mellitus in pregnancy, unspecified control: Secondary | ICD-10-CM

## 2018-12-15 DIAGNOSIS — O352XX0 Maternal care for (suspected) hereditary disease in fetus, not applicable or unspecified: Secondary | ICD-10-CM

## 2018-12-15 DIAGNOSIS — O0992 Supervision of high risk pregnancy, unspecified, second trimester: Secondary | ICD-10-CM

## 2018-12-15 MED ORDER — BLOOD PRESSURE KIT DEVI: 1.0000 | 0 refills | Status: DC | PRN

## 2018-12-15 NOTE — Progress Notes (Signed)
Subjective:  Catherine Houston is a 38 y.o. G3P2002 at 42w4dbeing seen today for ongoing prenatal care.  She is currently monitored for the following issues for this high-risk pregnancy and has Hereditary disease in family possibly affecting fetus, affecting management of mother, antepartum condition or complication, not applicable or unspecified fetus; History of genital HSV-1 infection; BMI 31.0-31.9,adult; Gestational diabetes mellitus (GDM) affecting pregnancy, antepartum; History of gestational diabetes mellitus (GDM); and Supervision of high risk pregnancy, antepartum on their problem list.  Patient reports no complaints.  Contractions: Not present. Vag. Bleeding: None.  Movement: Present. Denies leaking of fluid.   The following portions of the patient's history were reviewed and updated as appropriate: allergies, current medications, past family history, past medical history, past social history, past surgical history and problem list. Problem list updated.  Objective:   Vitals:   12/15/18 0826  BP: 107/70  Pulse: 80  Weight: 177 lb 9.6 oz (80.6 kg)    Fetal Status: Fetal Heart Rate (bpm): 147   Movement: Present     General:  Alert, oriented and cooperative. Patient is in no acute distress.  Skin: Skin is warm and dry. No rash noted.   Cardiovascular: Normal heart rate noted  Respiratory: Normal respiratory effort, no problems with respiration noted  Abdomen: Soft, gravid, appropriate for gestational age. Pain/Pressure: Absent     Pelvic:  Cervical exam deferred        Extremities: Normal range of motion.  Edema: None  Mental Status: Normal mood and affect. Normal behavior. Normal judgment and thought content.   Urinalysis:      Assessment and Plan:  Pregnancy: G3P2002 at 193w4d1. Supervision of high risk pregnancy, antepartum Stable - Blood Pressure Monitoring (BLOOD PRESSURE KIT) DEVI; 1 kit by Does not apply route as needed.  Dispense: 1 each; Refill: 0 - AFP, Serum, Open  Spina Bifida  2. Gestational diabetes mellitus (GDM) affecting pregnancy, antepartum Anatomy scan ordered CBG's in goal range, continue with current management  3. Hereditary disease in family possibly affecting fetus, affecting management of mother, antepartum condition or complication, not applicable or unspecified fetus LR NIPS  4. History of genital HSV-1 infection Suppression at 34-36 weeks  Preterm labor symptoms and general obstetric precautions including but not limited to vaginal bleeding, contractions, leaking of fluid and fetal movement were reviewed in detail with the patient. Please refer to After Visit Summary for other counseling recommendations.  Return in about 4 weeks (around 01/12/2019) for OB visit, face to face, faculty MD provider.   ErChancy MilroyMD

## 2018-12-15 NOTE — Progress Notes (Signed)
Patient reports fetal movement, denies pain. Pt has BG log, fasting today 92.

## 2018-12-15 NOTE — Patient Instructions (Signed)

## 2018-12-17 LAB — AFP, SERUM, OPEN SPINA BIFIDA
AFP MoM: 0.95
AFP Value: 30.1 ng/mL
Gest. Age on Collection Date: 16.4 weeks
Maternal Age At EDD: 37.5 yr
OSBR Risk 1 IN: 10000
Test Results:: NEGATIVE
Weight: 177 [lb_av]

## 2019-01-09 ENCOUNTER — Ambulatory Visit (HOSPITAL_COMMUNITY)
Admission: RE | Admit: 2019-01-09 | Discharge: 2019-01-09 | Disposition: A | Discharge status: Home / Self Care | Payer: Medicaid Other | Source: Ambulatory Visit | Attending: Obstetrics and Gynecology | Admitting: Obstetrics and Gynecology

## 2019-01-09 ENCOUNTER — Other Ambulatory Visit: Payer: Self-pay

## 2019-01-09 ENCOUNTER — Encounter (HOSPITAL_COMMUNITY): Payer: Self-pay

## 2019-01-09 ENCOUNTER — Ambulatory Visit (HOSPITAL_COMMUNITY): Payer: Medicaid Other | Admitting: *Deleted

## 2019-01-09 ENCOUNTER — Other Ambulatory Visit (HOSPITAL_COMMUNITY): Payer: Self-pay | Admitting: *Deleted

## 2019-01-09 VITALS — BP 114/65 | HR 75 | Temp 97.9°F

## 2019-01-09 DIAGNOSIS — Z3A20 20 weeks gestation of pregnancy: Secondary | ICD-10-CM

## 2019-01-09 DIAGNOSIS — O24419 Gestational diabetes mellitus in pregnancy, unspecified control: Secondary | ICD-10-CM

## 2019-01-09 DIAGNOSIS — O0990 Supervision of high risk pregnancy, unspecified, unspecified trimester: Secondary | ICD-10-CM

## 2019-01-09 DIAGNOSIS — O09292 Supervision of pregnancy with other poor reproductive or obstetric history, second trimester: Secondary | ICD-10-CM

## 2019-01-09 DIAGNOSIS — O24415 Gestational diabetes mellitus in pregnancy, controlled by oral hypoglycemic drugs: Secondary | ICD-10-CM | POA: Diagnosis not present

## 2019-01-09 DIAGNOSIS — Z3A19 19 weeks gestation of pregnancy: Secondary | ICD-10-CM | POA: Diagnosis not present

## 2019-01-09 DIAGNOSIS — O09522 Supervision of elderly multigravida, second trimester: Secondary | ICD-10-CM

## 2019-01-09 NOTE — Consult Note (Signed)
This patient was seen for a detailed fetal anatomy scan due to advanced maternal age.  The patient reports that she was diagnosed with early onset A2 gestational diabetes in her current pregnancy.  She was screened early for gestational diabetes as she has a history of diabetes in her prior pregnancy.  She is currently treated with twice daily glyburide.  She reports that some of her fingerstick values are elevated.  She reports that she already had a normal fetal echocardiogram performed in her current pregnancy.  The results of the echocardiogram were not available today.  She denies any other significant past medical history.   She had a cell free DNA test earlier in her pregnancy which indicated a low risk for trisomy 63, 75, and 13. A female fetus is predicted.   She was informed that the fetal growth and amniotic fluid level were appropriate for her gestational age.   There were no obvious fetal anomalies noted on today's ultrasound exam.  However, today's exam was limited due to the fetal position.  The patient was informed that anomalies may be missed due to technical limitations. If the fetus is in a suboptimal position or maternal habitus is increased, visualization of the fetus in the maternal uterus may be impaired.  The implications and management of diabetes in pregnancy was discussed in detail with the patient. She was advised that our goals for her fingerstick values are fasting values of 90-95 or less and two-hour postprandials of 120 or less.  Should her fingerstick values be above these values, the dosage of glyburide may have to be increased or she may have to be started on insulin to help her achieve better glycemic control. The patient was advised that getting her fingerstick values as close to these goals as possible would provide her with the most optimal obstetrical outcome.  The patient was advised that due to diabetes in pregnancy, we will continue to follow her with monthly  growth ultrasounds. Weekly fetal testing should be started at around 32 weeks.  The patient was advised that should the fetal growth appear normal and she maintains normal glucose control, that delivery at around 39 weeks is usually recommended.  Delivery at around 37 weeks will be recommended should her glycemic control be poor.  The increased risk of fetal aneuploidy due to advanced maternal age was discussed. Due to advanced maternal age, the patient was offered and declined an amniocentesis today for definitive diagnosis of fetal aneuploidy.  She is comfortable with her negative cell free DNA test.  A follow-up exam was scheduled in 4 weeks to obtain better views of the fetal anatomy and to assess the fetal growth.  A total of 30 minutes was spent counseling and coordinating the care for this patient.  Greater than 50% of the time was spent in direct face-to-face contact.

## 2019-01-10 ENCOUNTER — Other Ambulatory Visit (HOSPITAL_COMMUNITY): Payer: Self-pay | Admitting: Obstetrics and Gynecology

## 2019-01-11 ENCOUNTER — Telehealth (INDEPENDENT_AMBULATORY_CARE_PROVIDER_SITE_OTHER): Payer: Medicaid Other | Admitting: Obstetrics & Gynecology

## 2019-01-11 VITALS — BP 107/72

## 2019-01-11 DIAGNOSIS — O0992 Supervision of high risk pregnancy, unspecified, second trimester: Secondary | ICD-10-CM

## 2019-01-11 DIAGNOSIS — O24419 Gestational diabetes mellitus in pregnancy, unspecified control: Secondary | ICD-10-CM

## 2019-01-11 DIAGNOSIS — Z3A20 20 weeks gestation of pregnancy: Secondary | ICD-10-CM

## 2019-01-11 DIAGNOSIS — O0990 Supervision of high risk pregnancy, unspecified, unspecified trimester: Secondary | ICD-10-CM

## 2019-01-11 DIAGNOSIS — O352XX0 Maternal care for (suspected) hereditary disease in fetus, not applicable or unspecified: Secondary | ICD-10-CM

## 2019-01-11 MED ORDER — GLYBURIDE 2.5 MG PO TABS: 2.5000 mg | ORAL_TABLET | Freq: Two times a day (BID) | ORAL | 2 refills | Status: DC

## 2019-01-11 NOTE — Patient Instructions (Signed)
Return to office for any scheduled appointments. Call the office or go to the MAU at [facility] [place] if:  You begin to have strong, frequent contractions  Your water breaks.  Sometimes it is a big gush of fluid, sometimes it is just a trickle that keeps getting your panties wet or running down your legs  You have vaginal bleeding.  It is normal to have a small amount of spotting if your cervix was checked.   You do not feel your baby moving like normal.  If you do not, get something to eat and drink and lay down and focus on feeling your baby move.   If your baby is still not moving like normal, you should call the office or go to MAU.  Any other obstetric concerns.   

## 2019-01-11 NOTE — Progress Notes (Signed)
TELEHEALTH OBSTETRICS PRENATAL VIRTUAL VIDEO VISIT ENCOUNTER NOTE  Provider location: [facility] [place]   I connected with Catherine Houston on 01/11/19 at  8:15 AM EST by MyChart Video Encounter at home and verified that I am speaking with the correct person using two identifiers.   I discussed the limitations, risks, security and privacy concerns of performing an evaluation and management service virtually and the availability of in person appointments. I also discussed with the patient that there may be a patient responsible charge related to this service. The patient expressed understanding and agreed to proceed. Subjective:  Catherine Houston is a 38 y.o. G3P2002 at [redacted]w[redacted]d being seen today for ongoing prenatal care.  She is currently monitored for the following issues for this high-risk pregnancy and has Hereditary disease in family possibly affecting fetus, affecting management of mother, antepartum condition or complication, not applicable or unspecified fetus; History of genital HSV-1 infection; BMI 31.0-31.9,adult; Gestational diabetes mellitus (GDM) affecting pregnancy, antepartum; History of gestational diabetes mellitus (GDM); and Supervision of high risk pregnancy, antepartum on their problem list.  Patient reports no complaints.  Contractions: Not present. Vag. Bleeding: None.  Movement: Present. Denies any leaking of fluid.   The following portions of the patient's history were reviewed and updated as appropriate: allergies, current medications, past family history, past medical history, past social history, past surgical history and problem list.   Objective:   Vitals:   01/11/19 0811  BP: 107/72    Fetal Status:     Movement: Present     General:  Alert, oriented and cooperative. Patient is in no acute distress.  Respiratory: Normal respiratory effort, no problems with respiration noted  Mental Status: Normal mood and affect. Normal behavior. Normal judgment and  thought content.  Rest of physical exam deferred due to type of encounter  Imaging: [place] MFM OB DETAIL +14 WK  Result Date: 01/09/2019 ----------------------------------------------------------------------  OBSTETRICS REPORT                       (Signed Final 01/09/2019 01:27 pm) ---------------------------------------------------------------------- Patient Info  ID #:       [id number]                          D.O.B.:  1980-12-22 (38 yrs)  Name:       Catherine Houston                      Visit Date: 01/09/2019 08:06 am ---------------------------------------------------------------------- Performed By  Performed By:     Georgie Chard        Ref. Address:     [address]  [place], [place]  Attending:        Johnell Comings MD         [facility]  Referred By:      Chancy Milroy                    MD ---------------------------------------------------------------------- Orders   #  Description                          Code         Ordered By   1  [place] MFM OB DETAIL +14 [place]              76811.01     Arlina Robes  ----------------------------------------------------------------------   #  Order #                    Accession #                 Episode #   1  [id number]                  [id number]                  [id number]  ---------------------------------------------------------------------- Indications   Gestational diabetes in pregnancy,             O24.415   controlled by oral hypoglycemic drugs   Poor obstetric history: Previous gestational   O09.299   diabetes   Encounter for antenatal screening for          Z36.3   malformations   [redacted] weeks gestation of pregnancy                Z3A.20   ---------------------------------------------------------------------- Fetal Evaluation  Num Of Fetuses:         1  Fetal Heart Rate(bpm):  158  Cardiac Activity:       Observed  Presentation:           Breech  Placenta:               Posterior  P. Cord Insertion:      Visualized  Amniotic Fluid  AFI FV:      Within normal limits                              Largest Pocket(cm)                              4.14 ---------------------------------------------------------------------- Biometry  BPD:      50.4  mm     G.  Age:  21w 2d         88  %    CI:        75.09   %    70 - 86                                                          FL/HC:      18.3   %    16.8 - 19.8  HC:      184.5  mm     G. Age:  20w 6d         72  %    HC/AC:      1.10        1.09 - 1.39  AC:      167.4  mm     G. Age:  21w 5d         89  %    FL/BPD:     67.1   %  FL:       33.8  mm     G. Age:  20w 4d         59  %    FL/AC:      20.2   %    20 - 24  HUM:      32.4  mm     G. Age:  20w 6d         72  %  CER:      24.4  mm     G. Age:  22w 3d       > 95  %  CM:        5.2  mm  Est. FW:     406  gm    0 lb 14 oz      94  % ---------------------------------------------------------------------- OB History  Gravidity:    3         Term:   2  Living:       2 ---------------------------------------------------------------------- Gestational Age  LMP:           20w 1d        Date:  08/21/18                 EDD:   05/28/19  U/S Today:     21w 1d                                        EDD:   05/21/19  Best:          20w 1d     Det. By:  LMP  (08/21/18)          EDD:   05/28/19 ---------------------------------------------------------------------- Anatomy  Cranium:               Appears normal         Aortic Arch:            Appears normal  Cavum:                 Appears normal         Ductal Arch:            Appears normal  Ventricles:  Appears normal         Diaphragm:              Appears normal  Choroid Plexus:        Not well visualized     Stomach:                Appears normal, left                                                                        sided  Cerebellum:            Appears normal         Abdomen:                Appears normal  Posterior Fossa:       Appears normal         Abdominal Wall:         Appears nml (cord                                                                        insert, abd wall)  Nuchal Fold:           Not applicable ([id number]    Cord Vessels:           Appears normal ([redacted]                         wks GA)                                        vessel cord)  Face:                  Appears normal         Kidneys:                Not well visualized                         (orbits and profile)  Lips:                  Appears normal         Bladder:                Appears normal  Thoracic:              Appears normal         Spine:                  Not well visualized  Heart:                 Appears normal         Upper Extremities:      Visualized                         (  4CH, axis, and                         situs)  RVOT:                  Not well visualized    Lower Extremities:      Appears normal  LVOT:                  Not well visualized  Other:  Female gender. Nasal bone visualized. Technically difficult due to fetal          position. ---------------------------------------------------------------------- Cervix Uterus Adnexa  Cervix  Length:           4.05  cm.  Normal appearance by transabdominal scan. ---------------------------------------------------------------------- Comments  This patient was seen for a detailed fetal anatomy scan due  to advanced maternal age.  The patient reports that she was  diagnosed with early onset A2 gestational diabetes in her  current pregnancy.  She was screened early for gestational  diabetes as she has a history of diabetes in her prior  pregnancy.  She is currently treated with twice daily glyburide.  She reports that some of her fingerstick values are elevated.  She reports that  she already had a normal fetal  echocardiogram performed in her current pregnancy.  The  results of the echocardiogram were not available today.  She  denies any other significant past medical history.  She had a cell free DNA test earlier in her pregnancy which  indicated a low risk for trisomy 48, 49, and 13. A female fetus is  predicted.  She was informed that the fetal growth and amniotic fluid  level were appropriate for her gestational age.  There were no obvious fetal anomalies noted on today's  ultrasound exam.  However, today's exam was limited due to  the fetal position.  The patient was informed that anomalies may be missed due  to technical limitations. If the fetus is in a suboptimal position  or maternal habitus is increased, visualization of the fetus in  the maternal uterus may be impaired.  The implications and management of diabetes in pregnancy  was discussed in detail with the patient. She was advised that  our goals for her fingerstick values are fasting values of 90-95  or less and two-hour postprandials of 120 or less.  Should her  fingerstick values be above these values, the dosage of  glyburide may have to be increased or she may have to be  started on insulin to help her achieve better glycemic control.  The patient was advised that getting her fingerstick values as  close to these goals as possible would provide her with the  most optimal obstetrical outcome.  The patient was advised that due to diabetes in pregnancy,  we will continue to follow her with monthly growth  ultrasounds. Weekly fetal testing should be started at around  32 weeks.  The patient was advised that should the fetal  growth appear normal and she maintains normal glucose  control, that delivery at around 39 weeks is usually  recommended.  Delivery at around 37 weeks will be  recommended should her glycemic control be poor.  The increased risk of fetal aneuploidy due to advanced  maternal age was discussed. Due to  advanced maternal age,  the patient was offered and declined an amniocentesis today  for definitive diagnosis of fetal aneuploidy.  She is  comfortable with  her negative cell free DNA test.  A follow-up exam was scheduled in 4 weeks to obtain better  views of the fetal anatomy and to assess the fetal growth.  A total of 30 minutes was spent counseling and coordinating  the care for this patient.  Greater than 50% of the time was  spent in direct face-to-face contact. ----------------------------------------------------------------------                   Johnell Comings, MD Electronically Signed Final Report   01/09/2019 01:27 pm ----------------------------------------------------------------------   Assessment and Plan:  Pregnancy: [id number] at [redacted]w[redacted]d 1. Gestational diabetes mellitus (GDM) affecting pregnancy, antepartum CBGs show elevated PP after lunch and dinner.  Increased Glyburide to 2.5 mg po qam and qhs. Had anatomy scan, no concerns apart from increased EFW.  Normal fetal ECHO - glyBURIDE (DIABETA) 2.5 MG tablet; Take 1 tablet (2.5 mg total) by mouth 2 (two) times daily at 8 am and 10 pm.  Dispense: 90 tablet; Refill: 2  2. Hereditary disease in family possibly affecting fetus, affecting management of mother, antepartum condition or complication, not applicable or unspecified fetus Patient wants to talk to [employer] about this, and make sure this fetus has been tested. - AMB MFM GENETICS REFERRAL  3. Supervision of high risk pregnancy, antepartum Preterm labor symptoms and general obstetric precautions including but not limited to vaginal bleeding, contractions, leaking of fluid and fetal movement were reviewed in detail with the patient. I discussed the assessment and treatment plan with the patient. The patient was provided an opportunity to ask questions and all were answered. The patient agreed with the plan and demonstrated an understanding of the instructions. The patient was  advised to call back or seek an in-person office evaluation/go to MAU at [facility] for any urgent or concerning symptoms. Please refer to After Visit Summary for other counseling recommendations.   I provided 15 minutes of face-to-face time during this encounter.  Return in about 2 weeks (around 01/25/2019) for Virtual OB Visit (she prefers early morning slots ~ 8:15 am).  Future Appointments  Date Time Provider [place]  02/07/2019  8:15 AM [place] [facility]  02/07/2019  8:15 AM [place] 4 WH-MFCUS [facility]    Verita Schneiders, MD [facility] [place] [place]

## 2019-01-25 ENCOUNTER — Telehealth (INDEPENDENT_AMBULATORY_CARE_PROVIDER_SITE_OTHER): Payer: Medicaid Other | Admitting: Family Medicine

## 2019-01-25 DIAGNOSIS — O24419 Gestational diabetes mellitus in pregnancy, unspecified control: Secondary | ICD-10-CM

## 2019-01-25 DIAGNOSIS — O0992 Supervision of high risk pregnancy, unspecified, second trimester: Secondary | ICD-10-CM

## 2019-01-25 DIAGNOSIS — O352XX0 Maternal care for (suspected) hereditary disease in fetus, not applicable or unspecified: Secondary | ICD-10-CM

## 2019-01-25 DIAGNOSIS — Z3A11 11 weeks gestation of pregnancy: Secondary | ICD-10-CM

## 2019-01-25 DIAGNOSIS — B009 Herpesviral infection, unspecified: Secondary | ICD-10-CM

## 2019-01-25 DIAGNOSIS — O0990 Supervision of high risk pregnancy, unspecified, unspecified trimester: Secondary | ICD-10-CM

## 2019-01-25 NOTE — Progress Notes (Addendum)
   TELEHEALTH VIRTUAL OBSTETRICS VISIT ENCOUNTER NOTE  I connected with Catherine Houston on 01/25/19 at  8:15 AM EST by telephone at home and verified that I am speaking with the correct person using two identifiers.   I discussed the limitations, risks, security and privacy concerns of performing an evaluation and management service by telephone and the availability of in person appointments. I also discussed with the patient that there may be a patient responsible charge related to this service. The patient expressed understanding and agreed to proceed.  Subjective:  Catherine Houston is a 38 y.o. G3P2002 at [redacted]w[redacted]d being followed for ongoing prenatal care.  She is currently monitored for the following issues for this high-risk pregnancy and has Hereditary disease in family possibly affecting fetus, affecting management of mother, antepartum condition or complication, not applicable or unspecified fetus; History of genital HSV-1 infection; BMI 31.0-31.9,adult; Gestational diabetes mellitus (GDM) affecting pregnancy, antepartum; History of gestational diabetes mellitus (GDM); and Supervision of high risk pregnancy, antepartum on their problem list.  Patient reports no complaints. Reports fetal movement. Denies any contractions, bleeding or leaking of fluid.   She does report that some of her evening postprandial sugars have been 120 over the last few days. Her highest dinner post-prandial was 125.   The following portions of the patient's history were reviewed and updated as appropriate: allergies, current medications, past family history, past medical history, past social history, past surgical history and problem list.   Objective:   General:  Alert, oriented and cooperative.   Mental Status: Normal mood and affect perceived. Normal judgment and thought content.  Rest of physical exam deferred due to type of encounter  Assessment and Plan:  Pregnancy: G3P2002 at [redacted]w[redacted]d  1. Supervision of high risk  pregnancy, antepartum -continue routine prenatal care  2. Gestational diabetes mellitus (GDM) affecting pregnancy, antepartum Has had some elevated evening postprandials this week, highest 125. Offered to increase her glyburide but she wants to see if she can do better after the holidays. Does not wish to alter medication at this time, does not want to meet with diabetic educator.  3. Hereditary disease in family possibly affecting fetus, affecting management of mother, antepartum condition or complication, not applicable or unspecified fetus Horizon negative  4. History of genital HSV-1 infection Will need to be on Valtrex in third trimester   Preterm labor symptoms and general obstetric precautions including but not limited to vaginal bleeding, contractions, leaking of fluid and fetal movement were reviewed in detail with the patient.  I discussed the assessment and treatment plan with the patient. The patient was provided an opportunity to ask questions and all were answered. The patient agreed with the plan and demonstrated an understanding of the instructions. The patient was advised to call back or seek an in-person office evaluation/go to [facility] at [facility] for any urgent or concerning symptoms. Please refer to After Visit Summary for other counseling recommendations.   I provided 20 minutes of non-face-to-face time during this encounter.  No follow-ups on file.  Future Appointments  Date Time Provider [place]  02/07/2019  8:15 AM Pella Williams MFC-US  02/07/2019  8:15 AM [place] [facility]-MFCUS MFC-US  02/07/2019  9:00 AM [place] GENETIC COUNSELING RM [place] for [employer], [place]

## 2019-01-25 NOTE — Patient Instructions (Signed)
Gestational Diabetes Mellitus, Diagnosis Gestational diabetes (gestational diabetes mellitus) is a short-term (temporary) form of diabetes that can happen during pregnancy. It goes away after you give birth. It may be caused by one or both of these problems:  Your pancreas does not make enough of a hormone called insulin.  Your body does not respond in a normal way to insulin that it makes. Insulin lets sugars (glucose) go into cells in the body. This gives you energy. If you have diabetes, sugars cannot get into cells. This causes high blood sugar (hyperglycemia). If you get gestational diabetes, you are:  More likely to get it if you get pregnant again.  More likely to develop type 2 diabetes in the future. If gestational diabetes is treated, it may not hurt you or your baby. Your doctor will set treatment goals for you. In general, you should have these blood sugar levels:  After not eating for a long time (fasting): 95 mg/dL (5.3 mmol/L).  After meals (postprandial): ? One hour after a meal: at or below 140 mg/dL (7.8 mmol/L). ? Two hours after a meal: at or below 120 mg/dL (6.7 mmol/L).  A1c (hemoglobin A1c) level: 6-6.5%. Follow these instructions at home: Questions to ask your doctor   You may want to ask these questions: ? Do I need to meet with a diabetes educator? ? What equipment will I need to care for myself at home? ? What medicines do I need? When should I take them? ? How often do I need to check my blood sugar? ? What number can I call if I have questions? ? When is my next doctor's visit? General instructions  Take over-the-counter and prescription medicines only as told by your doctor.  Stay at a healthy weight during pregnancy.  Keep all follow-up visits as told by your doctor. This is important. Contact a doctor if:  Your blood sugar is at or above 240 mg/dL (13.3 mmol/L).  Your blood sugar is at or above 200 mg/dL (11.1 mmol/L) and you have ketones in  your pee (urine).  You have been sick or have had a fever for 2 days or more and you are not getting better.  You have any of these problems for more than 6 hours: ? You cannot eat or drink. ? You feel sick to your stomach (nauseous). ? You throw up (vomit). ? You have watery poop (diarrhea). Get help right away if:  Your blood sugar is lower than 54 mg/dL (3 mmol/L).  You get confused.  You have trouble: ? Thinking clearly. ? Breathing.  Your baby moves less than normal.  You have any of these: ? Moderate or large ketone levels in your pee. ? Blood coming from your vagina. ? Unusual fluid coming from your vagina. ? Early contractions. These may feel like tightness in your belly. Summary  Gestational diabetes is a short-term form of diabetes. It can happen while you are pregnant. It goes away after you give birth.  If gestational diabetes is treated, it may not hurt you or your baby. Your doctor will set treatment goals for you.  Keep all follow-up visits as told by your doctor. This is important. This information is not intended to replace advice given to you by your health care provider. Make sure you discuss any questions you have with your health care provider. Document Revised: 02/17/2017 Document Reviewed: 02/14/2015 Elsevier Patient Education  2020 Elsevier Inc.  

## 2019-01-26 NOTE — L&D Delivery Note (Signed)
OB/GYN Faculty Practice Delivery Note  Catherine Houston is a 39 y.o. G3P2002 s/p IOL for A2GDM on glyburide at [redacted]w[redacted]d. She was admitted for early labor and also gestational hypertension.   ROM: 8h 34m with light meconium fluid GBS Status:  Positive/-- (04/14 1049) Maximum Maternal Temperature: Temp (24hrs), Avg:98 F (36.7 C), Min:97.8 F (36.6 C), Max:98.3 F (36.8 C)   Labor Progress: . Initial SVE: 4/60/-2.  She progressed spontaneously to 7cm then had minimal progress over 2 hours so was started on pitocin. She then progressed to complete.   Delivery Date/Time: 05/21/2019 1954 Delivery: Called to room and patient was complete and pushing. Head delivered spontaneously and restituted to LOA position. No nuchal cord present. Shoulder and body delivered in usual fashion without dystocia. Infant with spontaneous cry, placed on mother's abdomen, dried and stimulated. Cord clamped x 2 after 1-minute delay, and cut by father. Cord blood collected. Placenta delivered spontaneously with gentle cord traction. Fundus firm with massage and Pitocin. Labia, perineum, vagina, and cervix inspected inspected with a mild abrasion in the left perieurethral region which was hemostatic and no lacerations requiring repair.   Baby Weight: pending  Placenta: Sent to L&D Complications: None Lacerations: none EBL: 250 mL Analgesia: Epidural   Infant:  APGAR (1 MIN): 8   APGAR (5 MINS): 9   APGAR (10 MINS):     Debbrah Alar, MD [place] for [facility], [facility] 05/21/2019, 8:53 PM

## 2019-02-07 ENCOUNTER — Other Ambulatory Visit (HOSPITAL_COMMUNITY): Payer: Self-pay | Admitting: *Deleted

## 2019-02-07 ENCOUNTER — Ambulatory Visit (HOSPITAL_COMMUNITY): Payer: Self-pay | Admitting: Genetic Counselor

## 2019-02-07 ENCOUNTER — Ambulatory Visit (HOSPITAL_COMMUNITY)
Admission: RE | Admit: 2019-02-07 | Discharge: 2019-02-07 | Disposition: A | Discharge status: Home / Self Care | Payer: Medicaid Other | Source: Ambulatory Visit | Attending: Obstetrics | Admitting: Obstetrics

## 2019-02-07 ENCOUNTER — Ambulatory Visit (HOSPITAL_COMMUNITY): Payer: Medicaid Other | Admitting: *Deleted

## 2019-02-07 ENCOUNTER — Other Ambulatory Visit: Payer: Self-pay

## 2019-02-07 ENCOUNTER — Encounter (HOSPITAL_COMMUNITY): Payer: Self-pay

## 2019-02-07 ENCOUNTER — Ambulatory Visit (HOSPITAL_BASED_OUTPATIENT_CLINIC_OR_DEPARTMENT_OTHER): Payer: Medicaid Other | Admitting: Genetic Counselor

## 2019-02-07 DIAGNOSIS — O0990 Supervision of high risk pregnancy, unspecified, unspecified trimester: Secondary | ICD-10-CM | POA: Diagnosis present

## 2019-02-07 DIAGNOSIS — O24419 Gestational diabetes mellitus in pregnancy, unspecified control: Secondary | ICD-10-CM

## 2019-02-07 DIAGNOSIS — O24415 Gestational diabetes mellitus in pregnancy, controlled by oral hypoglycemic drugs: Secondary | ICD-10-CM

## 2019-02-07 DIAGNOSIS — O352XX1 Maternal care for (suspected) hereditary disease in fetus, fetus 1: Secondary | ICD-10-CM

## 2019-02-07 DIAGNOSIS — Z362 Encounter for other antenatal screening follow-up: Secondary | ICD-10-CM | POA: Diagnosis not present

## 2019-02-07 DIAGNOSIS — Z8279 Family history of other congenital malformations, deformations and chromosomal abnormalities: Secondary | ICD-10-CM

## 2019-02-07 DIAGNOSIS — Z8489 Family history of other specified conditions: Secondary | ICD-10-CM

## 2019-02-07 DIAGNOSIS — Z315 Encounter for genetic counseling: Secondary | ICD-10-CM | POA: Diagnosis not present

## 2019-02-07 DIAGNOSIS — Z3A24 24 weeks gestation of pregnancy: Secondary | ICD-10-CM | POA: Diagnosis not present

## 2019-02-07 DIAGNOSIS — O09292 Supervision of pregnancy with other poor reproductive or obstetric history, second trimester: Secondary | ICD-10-CM

## 2019-02-07 NOTE — Progress Notes (Signed)
02/07/2019  Catherine Houston [birth year] MRN: [id number] DOV: 02/07/2019  Ms. Catherine Houston presented to the [facility] for Maternal Fetal [facility] for a genetics consultation regarding her history of a prior child with a chromosomal microdeletion syndrome. Ms. Catherine Houston came to her appointment alone due to COVID-19 visitor restrictions.   Indication for genetic counseling - Son with 2q24.3 microdeletion syndrome  Prenatal history  Ms. Catherine Houston is a G36P2002, 39 y.o. female. Her current pregnancy has completed [redacted]w[redacted]d (Estimated Date of Delivery: 05/28/19).  Ms. Catherine Houston denied exposure to environmental toxins or chemical agents. She denied the use of alcohol, tobacco or street drugs. She reported taking Glyburide, Aspirin, and prenatal vitamins. She denied significant viral illnesses, fevers, and bleeding during the course of her pregnancy. She has gestational diabetes. Otherwise, Her medical and surgical histories were noncontributory.  Family History  A three generation pedigree was drafted and reviewed. The family history is remarkable for the following:  - Ms. Catherine Houston has a 46 year old son from a prior relationship who has 2q24.3 microdeletion syndrome. He has seizures that are not well-controlled by numerous medications. He also has autism, does not speak, and has limited capacity for performing daily living skills. Ms. Catherine Houston had testing during a previous pregnancy that determine that she does not carry 2q24.3 microdeletion syndrome. See Discussion section for more details.   - Ms. Catherine Houston has a paternal aunt with seizures. Ms. Catherine Houston was not sure when these seizures began. This aunt's medical and developmental histories are otherwise noncontributory. Seizures can occur secondary to a variety of environmental, lifestyle, and genetic factors. This aunt's seizures are likely unrelated to Ms. Catherine Houston son's seizures since it does not appear that 2q24.3 microdeletion syndrome was passed on through Ms. Catherine Houston family. When epilepsy does  not have an identified genetic cause, the chance that a first degree relative of someone with epilepsy will also have or develop epilepsy is approximately 2-5%. Given that Ms. Catherine Houston aunt is a third degree relative to her fetus, recurrence risk for epilepsy may be slightly elevated over the general population risk of 1%. However, without knowing the etiology of her aunt's seizures, precise risk assessment is limited.  - Ms. Catherine Houston has a maternal aunt who has a son with cognitive delays. He required extra help in school and is not employed. He is able to perform daily living skills. We discussed that many times, learning difficulties are multifactorial in nature, occurring due to a combination of genetic and environmental factors that are difficult to identify. Learning disabilities can appear to run in families; thus, there is a chance that Ms. Catherine Houston children could also experience learning difficulties of some kind unrelated to 2q24.3 microdeletion syndrome. She understands that she should make the pediatrician aware of any concerns she has about her children's development.  The remaining family histories were reviewed and found to be noncontributory for birth defects, intellectual disability, recurrent pregnancy loss, and known genetic conditions. Ms. Catherine Houston had limited information about her partner's extended family history; thus, risk assessment was limited.  The patient's ethnicity is [place]. The father of the pregnancy's ethnicity is [place]. Ashkenazi Jewish ancestry and consanguinity were denied. Pedigree will be scanned under Media.  Discussion  Ms. Catherine Houston was referred to genetic counseling due to her history of a previous child with 2q24.3 microdeletion syndrome. Of note, her son with 2q24.3 microdeletion syndrome has a different father than the current fetus.  Ms. Catherine Houston and I reviewed 2q24.3 microdeletion syndrome. Features associated with this condition may include seizures, microcephaly (  a small  head), dysmorphic features, cleft lip/palate, eye and vision abnormalities, growth failure, failure to thrive, congenital heart defects, limb anomalies, developmental delays, learning disabilities, and autism. Features vary from individual to individual depending on the size of the deletion and the gene content included in the deleted region. Ms. Catherine Houston son's deletion contains at least two genes, including TTC21B and SCN1A. The SCN1A gene is associated with several autosomal dominant epilepsy syndromes.   2q24.3 microdeletion syndrome is most often de novo, occurring for the first time in one affected individual in a family. Rarely, 2q24.3 microdeletion syndrome may be inherited from a parent with a balanced chromosomal translocation. Recurrence risks depend on whether the microdeletion is de novo or inherited. Ms. Catherine Houston underwent karyotype and chromosomal microarray analysis during her previous pregnancy in 2018, which was normal. This means that her son's microdeletion was either paternally inherited, or, more likely, occurred for the very first time in him. Since Ms. 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  We also reviewed that Ms. Catherine Houston had [facility] NIPS through the laboratory [facility] that was low-risk for fetal aneuploidies. We reviewed that these results showed a less than 1 in 10,000 risk for trisomies 21, 18 and 13, and monosomy X (Turner syndrome).  In addition, the risk for triploidy and sex chromosome trisomies (47,XXX and 47,XXY) was also low. Ms. Catherine Houston elected to have cfDNA analysis for 22q11.2 deletion syndrome, which was also low risk (1 in 9000). We reviewed that while this testing identifies 94-99% of pregnancies with trisomy 3, trisomy 60, trisomy 35, sex chromosome aneuploidies, and triploidy, it is NOT diagnostic. A positive test result requires confirmation by [facility] or  amniocentesis, and a negative test result does not rule out a fetal chromosome abnormality. She also understands that this testing does not identify all genetic conditions.  Finally, we reviewed that Ms. Dewitt had Horizon carrier screening performed through [place], which was negative for 14 conditions. Thus, her risk to be a carrier for these 14 conditions (listed separately in the laboratory report) has been reduced but not eliminated. This puts her pregnancies at significantly decreased risk of being affected by one of these conditions.  An ultrasound was performed today prior to our visit to complete fetal anatomy. The ultrasound report will be sent under separate cover. There were no visualized fetal anomalies or markers suggestive of aneuploidy.  Ms. Capra was also counseled regarding diagnostic testing via amniocentesis. We discussed the technical aspects of the procedure and quoted up to a 1 in 500 (0.2%) risk for spontaneous pregnancy loss or other adverse pregnancy outcomes as a result of amniocentesis. Cultured cells from an amniocentesis sample allow for the visualization of a fetal karyotype, which can detect >99% of chromosomal aberrations. Chromosomal microarray can also be performed to identify smaller deletions or duplications of fetal chromosomal material, including 2q24.3 microdeletions. After careful consideration, Ms. Bencomo declined amniocentesis at this time. She understands that amniocentesis is available at any point after 16 weeks of pregnancy and that she may opt to undergo the procedure at a later date should she change her mind. She indicated that she wanted to discuss the option with her husband before making a final decision, but that she was comforted by her <1% recurrence risk for 2q24.3 microdeletion syndrome for the current fetus.  Additional screening and diagnostic testing were declined today. She understands that screening tests, including ultrasound, cannot rule  out all  birth defects or genetic syndromes. The patient was advised of this limitation and states she still does not want additional testing or screening at this time.   I counseled Ms. Obenauer regarding the above risks and available options. 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 The approximate face-to-face time with the genetic counselor was 30 minutes.  In summary:  Reviewed family history concerns and options for follow-up testing  Son from prior relationship with 2q24.3 microdeletion syndrome  Patient previously had chromosomal work-up which was normal. Risk of recurrence of 2q24.3 microdeletion syndrome for the current fetus and future pregnancies with current partner is <1%  Declined amniocentesis at this time  Reviewed low-risk NIPS results  Reduction in risk for Down syndrome,trisomy 18,trisomy 24, triploidy, sex chromosome aneuploidies, and 22q11.2deletion syndrome  Discussed negative carrier screening results  Reduction in risk to be carrier for 14 conditions  Reviewed results of ultrasound  No fetal anomalies or markers seen  Reduction in risk for fetal aneuploidy   Buelah Manis, MS Genetic Counselor

## 2019-02-22 ENCOUNTER — Other Ambulatory Visit: Payer: Self-pay

## 2019-02-22 ENCOUNTER — Ambulatory Visit (INDEPENDENT_AMBULATORY_CARE_PROVIDER_SITE_OTHER): Payer: Medicaid Other

## 2019-02-22 VITALS — BP 103/70 | HR 78 | Wt 186.2 lb

## 2019-02-22 DIAGNOSIS — O24419 Gestational diabetes mellitus in pregnancy, unspecified control: Secondary | ICD-10-CM

## 2019-02-22 DIAGNOSIS — O0992 Supervision of high risk pregnancy, unspecified, second trimester: Secondary | ICD-10-CM

## 2019-02-22 DIAGNOSIS — Z3A26 26 weeks gestation of pregnancy: Secondary | ICD-10-CM

## 2019-02-22 DIAGNOSIS — O0990 Supervision of high risk pregnancy, unspecified, unspecified trimester: Secondary | ICD-10-CM

## 2019-02-22 NOTE — Patient Instructions (Signed)
Gestational Diabetes Mellitus, Diagnosis Gestational diabetes (gestational diabetes mellitus) is a temporary form of diabetes that some women develop during pregnancy. It usually occurs around weeks 24-28 of pregnancy, and it goes away after delivery. Hormonal changes during pregnancy can interfere with insulin production and function, which may result in one or both of these problems:  The pancreas does not make enough of a hormone called insulin.  Cells in the body do not respond properly to insulin that the body makes (insulin resistance). Normally, insulin allows blood sugar (glucose) to enter cells in the body. The cells use glucose for energy. Insulin resistance or lack of insulin causes excess glucose to build up in the blood instead of going into cells. As a result, high blood glucose (hyperglycemia) develops. If gestational diabetes is treated, it is not likely to cause problems. If it is not controlled with treatment, it may cause problems during labor and delivery, and some of those problems can be harmful to the unborn baby (fetus) and the mother. Women who get gestational diabetes are more likely to develop it if they get pregnant again, and they are more likely to develop type 2 diabetes in the future. What increases the risk? This condition may be more likely to develop in pregnant women who:  Are older than age 25 during pregnancy.  Have a family history of diabetes.  Are overweight.  Had gestational diabetes in the past.  Have polycystic ovary syndrome (PCOS).  Are pregnant with twins or multiples.  Are of American-Indian, African-American, Hispanic/Latino, or Asian/Pacific Islander descent. What are the signs or symptoms? Most women do not notice symptoms of gestational diabetes because the symptoms are similar to normal symptoms of pregnancy. Symptoms of gestational diabetes may include:  Increased thirst (polydipsia).  Increased hunger(polyphagia).  Increased  urination (polyuria). How is this diagnosed? This condition may be diagnosed based on your blood glucose level, which may be checked with one or more of the following blood tests:  A fasting blood glucose (FBG) test. You will not be allowed to eat (you will fast) for 8 hours or longer before a blood sample is taken.  A random blood glucose test. This checks your blood glucose at any time of day regardless of when you ate.  An oral glucose tolerance test (OGTT). This is usually done during weeks 24-28 of pregnancy. ? For this test, you will have an FBG test done. Then, you will drink a beverage that contains glucose. Your blood glucose will be tested again one hour after you drink the glucose beverage (1-hour OGTT). ? If the 1-hour OGTT result is at or above 140 mg/dL (7.8 mmol/L), you will repeat the OGTT. This time, your blood glucose will be tested 3 hours after you drink the glucose beverage (3-hour OGTT). If you have risk factors, you may be screened for undiagnosed type 2 diabetes at your first health care visit during your pregnancy (prenatal visit). How is this treated?     Your treatment may be managed by a specialist called an endocrinologist. This condition is treated by following instructions from your health care provider about:  Eating a healthy diet and getting more physical activity. These changes are the most important ways to manage gestational diabetes.  Checking your blood glucose. Do this as often as told.  Taking diabetes medicines or insulin every day. These will only be prescribed if they are needed. ? If you use insulin, you may need to adjust your dosage based on how physically active   you are and what foods you eat. Your health care provider will tell you how to do this. Your health care provider will set treatment goals for you based on the stage of your pregnancy and any other medical conditions you have. Generally, the goal of treatment is to maintain the following  blood glucose levels during pregnancy:  Before meals (preprandial): at or below 95 mg/dL (5.3 mmol/L).  After meals (postprandial): ? One hour after a meal: at or below 140 mg/dL (7.8 mmol/L). ? Two hours after a meal: at or below 120 mg/dL (6.7 mmol/L).  A1c (hemoglobin A1c) level: 6-6.5%. Follow these instructions at home: Questions to ask your health care provider  Consider asking the following questions: ? Do I need to meet with a diabetes educator? ? What equipment will I need to manage my diabetes at home? ? What diabetes medicines do I need, and when should I take them? ? How often do I need to check my blood glucose? ? What number can I call if I have questions? ? When is my next appointment? General instructions  Take over-the-counter and prescription medicines only as told by your health care provider.  Manage your weight gain during pregnancy. The amount of weight that you are expected to gain depends on your pre-pregnancy BMI (body mass index).  Keep all follow-up visits as told by your health care provider. This is important.  For more information about diabetes, visit: ? American Diabetes Association (ADA): www.diabetes.org ? American Association of Diabetes Educators (AADE): www.diabeteseducator.org Contact a health care provider if:  Your blood glucose level is at or above 240 mg/dL (13.3 mmol/L).  Your blood glucose level is at or above 200 mg/dL (11.1 mmol/L) and you have ketones in your urine.  You have been sick or have had a fever for 2 days or longer and you are not getting better.  You have any of the following problems for more than 6 hours: ? You cannot eat or drink. ? You have nausea and vomiting. ? You have diarrhea. Get help right away if:  Your blood glucose is lower than 54 mg/dL (3 mmol/L).  You become confused or you have trouble thinking clearly.  You have difficulty breathing.  You have moderate or large ketone levels in your  urine.  Your baby is moving around less than usual.  You develop unusual discharge or bleeding from your vagina.  You start having contractions early (prematurely). Contractions may feel like a tightening in your lower abdomen. Summary  Gestational diabetes (gestational diabetes mellitus) is a temporary form of diabetes that some women develop during pregnancy. It usually occurs around weeks 24-28 of pregnancy, and it goes away after delivery.  This condition is treated by making diet and lifestyle changes and taking diabetes medicines or insulin, if needed.  Women who get gestational diabetes are more likely to develop it if they get pregnant again, and they are more likely to develop type 2 diabetes in the future. This information is not intended to replace advice given to you by your health care provider. Make sure you discuss any questions you have with your health care provider. Document Revised: 02/17/2017 Document Reviewed: 02/14/2015 Elsevier Patient Education  2020 [employer]  

## 2019-02-22 NOTE — Progress Notes (Signed)
   PRENATAL VISIT NOTE  Subjective:  Catherine Houston is a 39 y.o. G3P2002 at [redacted]w[redacted]d who presents today for routine prenatal care.  She is currently being monitored for supervision of a high-risk pregnancy with problems as listed below.  Patient has no pregnancy related concerns and endorses fetal movement.  She denies contractions and vaginal concerns including discharge, bleeding, leaking, itching, and burning.   Patient states that she has noticed that her "dinner numbers have spiked."  She reports eating baked chicken breast, rice, and veggies last night.   Patient Active Problem List   Diagnosis Date Noted  . History of gestational diabetes mellitus (GDM) 11/06/2018  . Supervision of high risk pregnancy, antepartum 11/06/2018  . Gestational diabetes mellitus (GDM) affecting pregnancy, antepartum 09/13/2016  . 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 BMI 31.0-31.9,adult 03/23/2016  . Hereditary disease in family possibly affecting fetus, affecting management of mother, antepartum condition or complication, not applicable or unspecified fetus     The following portions of the patient's history were reviewed and updated as appropriate: allergies, current medications, past family history, past medical history, past social history, past surgical history and problem list. Problem list updated.  Objective:   Vitals:   02/22/19 0846  BP: 103/70  Pulse: 78  Weight: 186 lb 3.2 oz (84.5 kg)    Fetal Status: Fetal Heart Rate (bpm): 138 Fundal Height: 30 cm Movement: Present     General:  Alert, oriented and cooperative. Patient is in no acute distress.  Skin: Skin is warm and dry.   Cardiovascular: Regular rate and rhythm.  Respiratory: Normal respiratory effort. CTA-Bilaterally  Abdomen: Soft, gravid, appropriate for gestational age.  Pelvic: Cervical exam deferred        Extremities: Normal range of motion.  Edema: None  Mental Status: Normal mood and affect. Normal behavior. Normal  judgment and thought content.   Assessment and Plan:  Pregnancy: G3P2002 at [redacted]w[redacted]d  1. Supervision of high risk pregnancy, antepartum -Anticipatory guidance for upcoming appts. -Reviewed need for growth [place] and BPP due to risk factors. -Already scheduled for [place] on Feb 10th  2. Gestational diabetes mellitus (GDM) affecting pregnancy, antepartum -Extensive discussion on need for further diet modification and even elimination to maintain adequate sugars. -Patient states she went to diabetic education two years ago and is therefore aware of what she needs to do. -Review of previous note shows patient with elevated blood sugars at dinner time, but declined increase in glyburide. -Patient informed of this findings and Dr. Eddie North consulted who agrees with  *Increasing glyburide to 5 in the am and 2.5 in the evening. *Also recommends patient go for diabetic education. -Patient informed of new therapy and recommendation for diabetic education. -Patient states she is unable to go to in-person education due to family obligations. -Will plan to for virtual visit in 2 weeks for review of blood sugars and response to therapy increase   Preterm labor symptoms and general obstetric precautions including but not limited to vaginal bleeding, contractions, leaking of fluid and fetal movement were reviewed with the patient.  Please refer to After Visit Summary for other counseling recommendations.  Return in about 2 weeks (around 03/08/2019) for HR-ROB via [facility].  Future Appointments  Date Time Provider [place]  03/07/2019  8:00 AM [facility] [place] [facility]  03/07/2019  8:00 AM [facility] [place] 3 [facility]    Maryann Conners, CNM 02/22/2019, 9:11 AM

## 2019-02-22 NOTE — Progress Notes (Signed)
Pt is here for ROB. [redacted]w[redacted]d.

## 2019-03-07 ENCOUNTER — Encounter (HOSPITAL_COMMUNITY): Payer: Self-pay

## 2019-03-07 ENCOUNTER — Ambulatory Visit (HOSPITAL_COMMUNITY)
Admission: RE | Admit: 2019-03-07 | Discharge: 2019-03-07 | Disposition: A | Discharge status: Home / Self Care | Payer: Medicaid Other | Source: Ambulatory Visit | Attending: Obstetrics and Gynecology | Admitting: Obstetrics and Gynecology

## 2019-03-07 ENCOUNTER — Ambulatory Visit (HOSPITAL_COMMUNITY): Payer: Medicaid Other | Admitting: *Deleted

## 2019-03-07 ENCOUNTER — Other Ambulatory Visit: Payer: Self-pay

## 2019-03-07 ENCOUNTER — Other Ambulatory Visit (HOSPITAL_COMMUNITY): Payer: Self-pay | Admitting: *Deleted

## 2019-03-07 VITALS — BP 112/69 | HR 85 | Temp 97.9°F

## 2019-03-07 DIAGNOSIS — Z3A28 28 weeks gestation of pregnancy: Secondary | ICD-10-CM | POA: Diagnosis not present

## 2019-03-07 DIAGNOSIS — O0990 Supervision of high risk pregnancy, unspecified, unspecified trimester: Secondary | ICD-10-CM | POA: Insufficient documentation

## 2019-03-07 DIAGNOSIS — Z362 Encounter for other antenatal screening follow-up: Secondary | ICD-10-CM | POA: Diagnosis not present

## 2019-03-07 DIAGNOSIS — O24415 Gestational diabetes mellitus in pregnancy, controlled by oral hypoglycemic drugs: Secondary | ICD-10-CM

## 2019-03-07 DIAGNOSIS — O09293 Supervision of pregnancy with other poor reproductive or obstetric history, third trimester: Secondary | ICD-10-CM | POA: Diagnosis not present

## 2019-03-08 ENCOUNTER — Telehealth (INDEPENDENT_AMBULATORY_CARE_PROVIDER_SITE_OTHER): Payer: Medicaid Other | Admitting: Obstetrics and Gynecology

## 2019-03-08 ENCOUNTER — Encounter: Payer: Self-pay | Admitting: Obstetrics and Gynecology

## 2019-03-08 DIAGNOSIS — B009 Herpesviral infection, unspecified: Secondary | ICD-10-CM

## 2019-03-08 DIAGNOSIS — O24419 Gestational diabetes mellitus in pregnancy, unspecified control: Secondary | ICD-10-CM

## 2019-03-08 DIAGNOSIS — O0990 Supervision of high risk pregnancy, unspecified, unspecified trimester: Secondary | ICD-10-CM

## 2019-03-08 DIAGNOSIS — Z6831 Body mass index (BMI) 31.0-31.9, adult: Secondary | ICD-10-CM

## 2019-03-08 DIAGNOSIS — Z3A28 28 weeks gestation of pregnancy: Secondary | ICD-10-CM

## 2019-03-08 DIAGNOSIS — O0993 Supervision of high risk pregnancy, unspecified, third trimester: Secondary | ICD-10-CM

## 2019-03-08 DIAGNOSIS — O98513 Other viral diseases complicating pregnancy, third trimester: Secondary | ICD-10-CM

## 2019-03-08 MED ORDER — GLYBURIDE 2.5 MG PO TABS: ORAL_TABLET | ORAL | 3 refills | Status: DC

## 2019-03-08 NOTE — Progress Notes (Signed)
TELEHEALTH OBSTETRICS PRENATAL VIRTUAL VIDEO VISIT ENCOUNTER NOTE  Provider location: [facility] [place]   I connected with Catherine Houston on 03/08/19 at  9:00 AM EST by MyChart Video Encounter at home and verified that I am speaking with the correct person using two identifiers.   I discussed the limitations, risks, security and privacy concerns of performing an evaluation and management service virtually and the availability of in person appointments. I also discussed with the patient that there may be a patient responsible charge related to this service. The patient expressed understanding and agreed to proceed. Subjective:  Catherine Houston is a 39 y.o. G3P2002 at 62w3dbeing seen today for ongoing prenatal care.  She is currently monitored for the following issues for this high-risk pregnancy and has Hereditary disease in family possibly affecting fetus, affecting management of mother, antepartum condition or complication, not applicable or unspecified fetus; History of genital HSV-1 infection; BMI 31.0-31.9,adult; Gestational diabetes mellitus (GDM) affecting pregnancy, antepartum; History of gestational diabetes mellitus (GDM); and Supervision of high risk pregnancy, antepartum on their problem list.  Patient reports no complaints.  Contractions: Not present. Vag. Bleeding: None.  Movement: Present. Denies any leaking of fluid.   The following portions of the patient's history were reviewed and updated as appropriate: allergies, current medications, past family history, past medical history, past social history, past surgical history and problem list.   Objective:  There were no vitals filed for this visit.  Fetal Status:     Movement: Present     General:  Alert, oriented and cooperative. Patient is in no acute distress.  Respiratory: Normal respiratory effort, no problems with respiration noted  Mental Status: Normal mood and affect. Normal behavior. Normal judgment and  thought content.  Rest of physical exam deferred due to type of encounter  Imaging: [place]FM OB FOLLOW UP  Result Date: 03/07/2019 ----------------------------------------------------------------------  OBSTETRICS REPORT                    (Corrected Final 03/07/2019 09:24 am) ---------------------------------------------------------------------- Patient Info  ID #:       [id number]                         D.O.B.:  1Aug 20, 1982(38 yrs)  Name:       Catherine Houston                     Visit Date: 03/07/2019 08:55 am ---------------------------------------------------------------------- Performed By  Performed By:     CRodrigo RanBS      Ref. Address:     [address]  [place], [place]  Attending:        Sander Nephew      [facility]                    MD                                       [facility]  Referred By:      Chancy Milroy                    MD ---------------------------------------------------------------------- Orders   #  Description                          Code         Ordered By   1  [place] MFM OB FOLLOW UP                  07622.63     Tama High  ----------------------------------------------------------------------   #  Order #                    Accession #                 Episode #   1  [id number]                  [id number]                  [id number]  ---------------------------------------------------------------------- Indications   Antenatal follow-up for nonvisualized fetal    Z36.2   anatomy   Gestational diabetes in pregnancy,             O24.415   controlled by oral hypoglycemic drugs   Poor obstetric history: Previous gestational   O09.299   diabetes   Low-risk NIPS   Previous pregnancy 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 Cord Insertion:      Visualized  Amniotic Fluid  AFI FV:      Within normal limits  AFI Sum(cm)     %Tile       Largest Pocket(cm)  12.52           32          4.49  RUQ(cm)  LUQ(cm)        LLQ(cm)  4.49                        4.43           3.6 ---------------------------------------------------------------------- Biometry  BPD:      75.6  mm     G. Age:  30w 2d         92  %    CI:        71.71   %    70 - 86                                                          FL/HC:      18.4   %    18.8 - 20.6  HC:      284.2  mm     G. Age:  31w 1d         94  %    HC/AC:      1.08        1.05 - 1.21  AC:      263.6  mm     G. Age:  30w 3d         94  %    FL/BPD:     69.0   %    71 - 87  FL:       52.2  mm     G. Age:  27w 6d         22  %    FL/AC:      19.8   %    20 - 24  HUM:      47.7  mm     G. Age:  28w 0d         40  %  Est. FW:    1438  gm      3 lb 3 oz     87  % ---------------------------------------------------------------------- OB History  Gravidity:    3         Term:   2  Living:       2 ---------------------------------------------------------------------- Gestational Age  LMP:           28w 2d        Date:  08/21/18                 EDD:   05/28/19  U/S Today:     30w 0d                                        EDD:   05/16/19  Best:          28w 2d     Det. By:  LMP  (08/21/18)          EDD:   05/28/19 ---------------------------------------------------------------------- Anatomy  Cranium:               Appears normal         Aortic Arch:            Previously seen  Cavum:  Appears normal         Ductal Arch:             Previously seen  Ventricles:            Appears normal         Diaphragm:              Appears normal  Choroid Plexus:        Appears normal         Stomach:                Appears normal, left                                                                        sided  Cerebellum:            Appears normal         Abdomen:                Previously seen  Posterior Fossa:       Appears normal         Abdominal Wall:         Previously seen  Nuchal Fold:           Previously seen        Cord Vessels:           Appears normal (3                                                                        vessel cord)  Face:                  Appears normal         Kidneys:                Appear normal                         (orbits and profile)  Lips:                  Appears normal         Bladder:                Appears normal  Thoracic:              Appears normal         Spine:                  Previously seen  Heart:                 Appears normal         Upper Extremities:      Previously seen                         (4CH, axis, and  situs)  RVOT:                  Not well visualized    Lower Extremities:      Previously seen  LVOT:                  Appears normal ---------------------------------------------------------------------- Cervix Uterus Adnexa  Cervix  Not visualized (advanced GA >24wks)  Uterus  No abnormality visualized.  Left Ovary  Not visualized.  Right Ovary  Within normal limits.  Cul De Sac  No free fluid seen.  Adnexa  No abnormality visualized. ---------------------------------------------------------------------- Impression  A2GDM  Normal interval growth with good fetal movement and  amniotic fluid volume.  Lowr risk NIPS  Normal fetal echocardiogram ---------------------------------------------------------------------- Recommendations  Initiate weekly testing at 32 weeks.  Follow up growth in 5 weeks.  ----------------------------------------------------------------------                    Sander Nephew, MD Electronically Signed Corrected Final Report  03/07/2019 09:24 am ----------------------------------------------------------------------  [place] MFM OB FOLLOW UP  Result Date: 02/07/2019 ----------------------------------------------------------------------  OBSTETRICS REPORT                       (Signed Final 02/07/2019 11:09 am) ---------------------------------------------------------------------- Patient Info  ID #:       [id number]                          D.O.B.:  [birth year] (38 yrs)  Name:       Catherine Houston                      Visit Date: 02/07/2019 08:30 am ---------------------------------------------------------------------- Performed By  Performed By:     Enriqueta Shutter           Ref. Address:     [address] [place], [place]  Attending:        Tama High MD        [facility]  Referred By:      Chancy Milroy                    MD ---------------------------------------------------------------------- Orders   #  Description                          Code         Ordered By   1  [place] MFM OB FOLLOW UP                  19379.02     YU FANG  ----------------------------------------------------------------------   #  Order #                    Accession #                 Episode #   1  [id number]                  [id number]                  [id number]  ---------------------------------------------------------------------- Indications   Antenatal follow-up for nonvisualized fetal    Z36.2   anatomy   Gestational diabetes in pregnancy,             O24.415   controlled by oral hypoglycemic  drugs   Poor obstetric history: Previous gestational   O09.299   diabetes   [redacted] weeks gestation of pregnancy                Z3A.24   Low-risk NIPS   Maternal care for (suspected) hereditary       O35.2XX1   disease in fetus, fetus 1 (Prev child with   deletion)  ---------------------------------------------------------------------- Fetal Evaluation  Num Of Fetuses:         1  Fetal Heart Rate(bpm):  158  Cardiac Activity:       Observed  Presentation:           Cephalic  Placenta:               Posterior Fundal  P. Cord Insertion:      Previously Visualized  Amniotic Fluid  AFI FV:      Subjectively within normal limits                              Largest Pocket(cm)                              5.61 ---------------------------------------------------------------------- Biometry  BPD:      61.9  mm     G. Age:  25w 1d         73  %    CI:        69.01   %    70 - 86                                                          FL/HC:      19.0   %    18.7 - 20.9  HC:       238   mm  G. Age:  25w 6d         85  %    HC/AC:      1.13        1.05 - 1.21  AC:      210.8  mm     G. Age:  25w 4d         81  %    FL/BPD:     73.2   %    71 - 87  FL:       45.3  mm     G. Age:  25w 0d         60  %    FL/AC:      21.5   %    20 - 24  LV:        5.4  mm  Est. FW:     803  gm    1 lb 12 oz      87  % ---------------------------------------------------------------------- OB History  Gravidity:    3         Term:   2  Living:       2 ---------------------------------------------------------------------- Gestational Age  LMP:           24w 2d        Date:  08/21/18                 EDD:   05/28/19  U/S Today:     25w 3d                                        EDD:   05/20/19  Best:          24w 2d     Det. By:  LMP  (08/21/18)          EDD:   05/28/19 ---------------------------------------------------------------------- Anatomy  Cranium:               Appears normal         Aortic Arch:            See echo report  Cavum:                  Appears normal         Ductal Arch:            See echo report  Ventricles:            Appears normal         Diaphragm:              Appears normal  Choroid Plexus:        Appears normal         Stomach:                Appears normal, left                                                                        sided  Cerebellum:            Previously seen        Abdomen:  Previously seen  Posterior Fossa:       Previously seen        Abdominal Wall:         Previously seen  Nuchal Fold:           Previously seen        Cord Vessels:           Previously seen  Face:                  Previously seen        Kidneys:                Appear normal  Lips:                  Previously seen        Bladder:                Appears normal  Heart:                 Appears normal         Spine:                  Appears normal                         (4CH, axis, and                         situs)  RVOT:                  See echo report        Upper Extremities:      Appears normal  LVOT:                  See echo report        Lower Extremities:      Previously seen  Other:  Fetus appears to be a female. ---------------------------------------------------------------------- Impression  Patient returned for completion of fetal anatomy. Fetal growth  is appropriate for gestational age. Amniotic fluid is normal  and good fetal activity is seen. Fetal anatomical survey was  completed and appears normal.  She had fetal echocardiography that was reported as normal.  Patient has gestational diabetes and takes glyburide 2.5 mg  twice daily.  She reports her fasting and postprandial blood  glucose levels are within normal range.  She does not  experience hypoglycemic symptoms.  Her first child has chromosomal 2 deletion and the patient  met with our genetic counselor today.  He will be receiving a  separate letter from our genetic counselor. ----------------------------------------------------------------------  Recommendations  -An appointment was made for her to return in 4 weeks for  fetal growth assessment. ----------------------------------------------------------------------                  Tama High, MD Electronically Signed Final Report   02/07/2019 11:09 am ----------------------------------------------------------------------   Assessment and Plan:  Pregnancy: [id number] at 48w3d1. Supervision of high risk pregnancy, antepartum Patient is doing well without complaints Patient to come in for third trimester labs (without glucola) next week Patient plans vasectomy for contraception  2. Gestational diabetes mellitus (GDM) affecting pregnancy, antepartum CBGs reviewed and majority of values within range. Patient reports fasting range of 79-93 with 93 being her single highest value and pp as high as 118 Will continue on glyburide 567m2.5mg Continue ASA Follow up growth ultrasound 3/11  3. History of genital HSV-1 infection Prophylaxis at 36 weeks  4. BMI 31.0-31.9,adult   Preterm labor symptoms and general obstetric precautions including but not limited to vaginal bleeding, contractions, leaking of fluid and fetal movement were reviewed in detail with the patient. I discussed the assessment and treatment plan with the patient. The patient was provided an opportunity to ask questions and all were answered. The patient agreed with the plan and demonstrated an understanding of the instructions. The patient was advised to call back or seek an in-person office evaluation/go to [facility]U at [facility] for any urgent or concerning symptoms. Please refer to After Visit Summary for other counseling recommendations.   I provided 11 minutes of face-to-face time during this encounter.  No follow-ups on file.  Future Appointments  Date Time Provider [place]  03/08/2019  9:00 AM Ajla Mcgeachy, Vickii Chafe, MD [place] None  04/05/2019  8:00 AM [place] MFC-US  04/05/2019  8:00 AM  WH-MFC [place] 3 WH-MFCUS MFC-US  04/12/2019  7:45 AM WH-MFC NURSE WH-MFC MFC-US  04/12/2019  7:45 AM WH-MFC [place] 2 WH-MFCUS MFC-US    Mora Bellman, MD [facility], [place]

## 2019-03-08 NOTE — Progress Notes (Signed)
Virtual ROB  CC: None

## 2019-03-13 ENCOUNTER — Other Ambulatory Visit: Payer: Self-pay

## 2019-03-13 ENCOUNTER — Other Ambulatory Visit: Payer: Medicaid Other

## 2019-03-13 DIAGNOSIS — O0990 Supervision of high risk pregnancy, unspecified, unspecified trimester: Secondary | ICD-10-CM

## 2019-03-14 LAB — CBC
Hematocrit: 34.5 % (ref 34.0–46.6)
Hemoglobin: 11.4 g/dL (ref 11.1–15.9)
MCH: 28.6 pg (ref 26.6–33.0)
MCHC: 33 g/dL (ref 31.5–35.7)
MCV: 87 fL (ref 79–97)
Platelets: 273 10*3/uL (ref 150–450)
RBC: 3.99 x10E6/uL (ref 3.77–5.28)
RDW: 13.6 % (ref 11.7–15.4)
WBC: 10.1 10*3/uL (ref 3.4–10.8)

## 2019-03-14 LAB — RPR: RPR Ser Ql: NONREACTIVE

## 2019-03-14 LAB — HIV ANTIBODY (ROUTINE TESTING W REFLEX): HIV Screen 4th Generation wRfx: NONREACTIVE

## 2019-03-22 ENCOUNTER — Telehealth (INDEPENDENT_AMBULATORY_CARE_PROVIDER_SITE_OTHER): Payer: Medicaid Other | Admitting: Obstetrics & Gynecology

## 2019-03-22 VITALS — BP 113/73

## 2019-03-22 DIAGNOSIS — O98513 Other viral diseases complicating pregnancy, third trimester: Secondary | ICD-10-CM | POA: Diagnosis not present

## 2019-03-22 DIAGNOSIS — Z3A30 30 weeks gestation of pregnancy: Secondary | ICD-10-CM | POA: Diagnosis not present

## 2019-03-22 DIAGNOSIS — Z8632 Personal history of gestational diabetes: Secondary | ICD-10-CM

## 2019-03-22 DIAGNOSIS — O0990 Supervision of high risk pregnancy, unspecified, unspecified trimester: Secondary | ICD-10-CM

## 2019-03-22 DIAGNOSIS — O24419 Gestational diabetes mellitus in pregnancy, unspecified control: Secondary | ICD-10-CM | POA: Diagnosis not present

## 2019-03-22 DIAGNOSIS — B009 Herpesviral infection, unspecified: Secondary | ICD-10-CM

## 2019-03-22 NOTE — Progress Notes (Signed)
   TELEHEALTH VIRTUAL OBSTETRICS VISIT ENCOUNTER NOTE  I connected with Catherine Houston on 03/22/19 at  8:45 AM EST by telephone at home and verified that I am speaking with the correct person using two identifiers.   I discussed the limitations, risks, security and privacy concerns of performing an evaluation and management service by telephone and the availability of in person appointments. I also discussed with the patient that there may be a patient responsible charge related to this service. The patient expressed understanding and agreed to proceed.  Subjective:  Catherine Houston is a 39 y.o. G3P2002 at [redacted]w[redacted]d being followed for ongoing prenatal care.  She is currently monitored for the following issues for this high-risk pregnancy and has Hereditary disease in family possibly affecting fetus, affecting management of mother, antepartum condition or complication, not applicable or unspecified fetus; History of genital HSV-1 infection; BMI 31.0-31.9,adult; Gestational diabetes mellitus (GDM) affecting pregnancy, antepartum; History of gestational diabetes mellitus (GDM); and Supervision of high risk pregnancy, antepartum on their problem list.  Patient reports no complaints. Reports fetal movement. Denies any contractions, bleeding or leaking of fluid.   The following portions of the patient's history were reviewed and updated as appropriate: allergies, current medications, past family history, past medical history, past social history, past surgical history and problem list.   Objective:   General:  Alert, oriented and cooperative.   Mental Status: Normal mood and affect perceived. Normal judgment and thought content.  Rest of physical exam deferred due to type of encounter  Assessment and Plan:  Pregnancy: G3P2002 at [redacted]w[redacted]d 1. Supervision of high risk pregnancy, antepartum GDM class A2/B  2. History of gestational diabetes mellitus (GDM)   3. Gestational diabetes mellitus (GDM) affecting  pregnancy, antepartum Glyburide, FBS low 90s and PP <121  4. History of genital HSV-1 infection Never had sx, had serology positive, don't recommend prophylaxis unless pt requests  Preterm labor symptoms and general obstetric precautions including but not limited to vaginal bleeding, contractions, leaking of fluid and fetal movement were reviewed in detail with the patient.  I discussed the assessment and treatment plan with the patient. The patient was provided an opportunity to ask questions and all were answered. The patient agreed with the plan and demonstrated an understanding of the instructions. The patient was advised to call back or seek an in-person office evaluation/go to [facility] at [facility] for any urgent or concerning symptoms. Please refer to After Visit Summary for other counseling recommendations.   I provided 11 minutes of non-face-to-face time during this encounter.  Return in about 2 weeks (around 04/05/2019) for virtual.  Future Appointments  Date Time Provider [place]  04/05/2019  8:00 AM [place] MFC-US  04/05/2019  8:00 AM WH-MFC [place] 3 WH-MFCUS MFC-US  04/12/2019  7:45 AM [place] NURSE [place] MFC-US  04/12/2019  7:45 AM [place] 2 WH-MFCUS MFC-US    Catherine Houston, Catherine for [place], Catherine

## 2019-03-22 NOTE — Patient Instructions (Signed)

## 2019-04-05 ENCOUNTER — Other Ambulatory Visit: Payer: Self-pay

## 2019-04-05 ENCOUNTER — Ambulatory Visit (HOSPITAL_COMMUNITY): Payer: Medicaid Other | Admitting: *Deleted

## 2019-04-05 ENCOUNTER — Ambulatory Visit (HOSPITAL_COMMUNITY)
Admission: RE | Admit: 2019-04-05 | Discharge: 2019-04-05 | Disposition: A | Discharge status: Home / Self Care | Payer: Medicaid Other | Source: Ambulatory Visit | Attending: Obstetrics and Gynecology | Admitting: Obstetrics and Gynecology

## 2019-04-05 ENCOUNTER — Encounter (HOSPITAL_COMMUNITY): Payer: Self-pay

## 2019-04-05 ENCOUNTER — Other Ambulatory Visit (HOSPITAL_COMMUNITY): Payer: Self-pay | Admitting: *Deleted

## 2019-04-05 DIAGNOSIS — O09293 Supervision of pregnancy with other poor reproductive or obstetric history, third trimester: Secondary | ICD-10-CM | POA: Diagnosis not present

## 2019-04-05 DIAGNOSIS — Z362 Encounter for other antenatal screening follow-up: Secondary | ICD-10-CM

## 2019-04-05 DIAGNOSIS — O24415 Gestational diabetes mellitus in pregnancy, controlled by oral hypoglycemic drugs: Secondary | ICD-10-CM

## 2019-04-05 DIAGNOSIS — O0990 Supervision of high risk pregnancy, unspecified, unspecified trimester: Secondary | ICD-10-CM

## 2019-04-05 DIAGNOSIS — Z3A32 32 weeks gestation of pregnancy: Secondary | ICD-10-CM

## 2019-04-06 ENCOUNTER — Telehealth (INDEPENDENT_AMBULATORY_CARE_PROVIDER_SITE_OTHER): Payer: Medicaid Other | Admitting: Obstetrics and Gynecology

## 2019-04-06 ENCOUNTER — Encounter: Payer: Self-pay | Admitting: Obstetrics and Gynecology

## 2019-04-06 DIAGNOSIS — O0990 Supervision of high risk pregnancy, unspecified, unspecified trimester: Secondary | ICD-10-CM

## 2019-04-06 DIAGNOSIS — Z3A32 32 weeks gestation of pregnancy: Secondary | ICD-10-CM

## 2019-04-06 DIAGNOSIS — O352XX0 Maternal care for (suspected) hereditary disease in fetus, not applicable or unspecified: Secondary | ICD-10-CM

## 2019-04-06 DIAGNOSIS — O24419 Gestational diabetes mellitus in pregnancy, unspecified control: Secondary | ICD-10-CM

## 2019-04-06 NOTE — Progress Notes (Signed)
Pt is on the phone preparing for virtual visit with provider. [redacted]w[redacted]d.

## 2019-04-06 NOTE — Progress Notes (Signed)
TELEHEALTH OBSTETRICS PRENATAL VIRTUAL VIDEO VISIT ENCOUNTER NOTE  Provider location: [facility] at [place]   I connected with Catherine Houston on 04/06/19 at  9:15 AM EST by MyChart Video Encounter at home and verified that I am speaking with the correct person using two identifiers.   I discussed the limitations, risks, security and privacy concerns of performing an evaluation and management service virtually and the availability of in person appointments. I also discussed with the patient that there may be a patient responsible charge related to this service. The patient expressed understanding and agreed to proceed. Subjective:  Catherine Houston is a 39 y.o. G3P2002 at [redacted]w[redacted]d being seen today for ongoing prenatal care.  She is currently monitored for the following issues for this high-risk pregnancy and has Hereditary disease in family possibly affecting fetus, affecting management of mother, antepartum condition or complication, not applicable or unspecified fetus; History of genital HSV-1 infection; BMI 31.0-31.9,adult; Gestational diabetes mellitus (GDM) affecting pregnancy, antepartum; History of gestational diabetes mellitus (GDM); and Supervision of high risk pregnancy, antepartum on their problem list.  Patient reports no complaints.  Contractions: Not present. Vag. Bleeding: None.  Movement: Present. Denies any leaking of fluid.   The following portions of the patient's history were reviewed and updated as appropriate: allergies, current medications, past family history, past medical history, past social history, past surgical history and problem list.   Objective:  There were no vitals filed for this visit.  Fetal Status:     Movement: Present     General:  Alert, oriented and cooperative. Patient is in no acute distress.  Respiratory: Normal respiratory effort, no problems with respiration noted  Mental Status: Normal mood and affect. Normal behavior. Normal judgment and  thought content.  Rest of physical exam deferred due to type of encounter  Imaging: [place] MFM FETAL BPP WO NON STRESS  Result Date: 04/05/2019 ----------------------------------------------------------------------  OBSTETRICS REPORT                       (Signed Final 04/05/2019 11:39 am) ---------------------------------------------------------------------- Patient Info  ID #:       [id number]                          D.O.B.:  04-28-1980 (38 yrs)  Name:       Catherine Houston                      Visit Date: 04/05/2019 08:37 am ---------------------------------------------------------------------- Performed By  Performed By:     Georgie Chard        Ref. Address:     [address]  [place], [place]  Attending:        Tama High MD        [facility]  Referred By:      Chancy Milroy                    MD ---------------------------------------------------------------------- Orders   #  Description                          Code         Ordered By   1  [place] MFM FETAL BPP WO NON              76819.01     [place]   2  [place] MFM OB FOLLOW UP                  76816.01     Sander Nephew  ----------------------------------------------------------------------   #  Order #                    Accession #                 Episode #   1  [id number]                  [id number]                  [id number]   2  [id number]                  [id number]                  [id number]  ---------------------------------------------------------------------- Indications   Antenatal follow-up for nonvisualized fetal    Z36.2    anatomy   Gestational diabetes in pregnancy,             O24.415   controlled by oral hypoglycemic drugs   Poor obstetric history: Previous gestational   O09.299   diabetes   Low-risk NIPS   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 Cord Insertion:      Previously Visualized  Amniotic Fluid  AFI FV:      Within normal limits  AFI Sum(cm)     %Tile       Largest Pocket(cm)  12.01           32          4.95  RUQ(cm)       RLQ(cm)       LUQ(cm)        LLQ(cm)  2.98          2.08          4.95           2 ---------------------------------------------------------------------- Biophysical Evaluation  Amniotic F.V:   Within normal limits       F. Tone:        Observed  F. Movement:    Observed                   Score:          8/8  F. Breathing:   Observed ---------------------------------------------------------------------- Biometry  BPD:      84.8  mm     G. Age:  34w 1d         87  %    CI:        77.29   %    70 - 86                                                          FL/HC:      22.1   %    19.1 - 21.3  HC:      305.4  mm     G. Age:  34w 0d         55  %    HC/AC:      0.93        0.96 - 1.17  AC:      327.6  mm     G. Age:  36w 5d       > 99  %    FL/BPD:     79.6   %    71 - 87  FL:       67.5  mm     G. Age:  34w 5d         90  %    FL/AC:      20.6   %    20 - 24  HUM:      55.5  mm     G. Age:  32w 2d         51  %  LV:  2.1  mm  Est. FW:    2719  gm           6 lb   > 99  % ---------------------------------------------------------------------- OB History  Gravidity:    3         Term:   2  Living:       2  ---------------------------------------------------------------------- Gestational Age  LMP:           32w 3d        Date:  08/21/18                 EDD:   05/28/19  U/S Today:     34w 6d                                        EDD:   05/11/19  Best:          Catherine Houston 3d     Det. By:  LMP  (08/21/18)          EDD:   05/28/19 ---------------------------------------------------------------------- Anatomy  Cranium:               Appears normal         LVOT:                   Previously seen  Cavum:                 Appears normal         Aortic Arch:            Previously seen  Ventricles:            Appears normal         Ductal Arch:            Previously seen  Choroid Plexus:        Previously seen        Diaphragm:              Appears normal  Cerebellum:            Previously seen        Stomach:                Appears normal, left                                                                        sided  Posterior Fossa:       Previously seen        Abdomen:                Appears normal  Nuchal Fold:           Not applicable (Q000111Q    Abdominal Wall:         Previously seen                         wks GA)  Face:                  Orbits and profile     Cord Vessels:  Previously seen                         previously seen  Lips:                  Previously seen        Kidneys:                Previously seen  Palate:                Previously seen        Bladder:                Appears normal  Thoracic:              Appears normal         Spine:                  Previously seen  Heart:                 Appears normal         Upper Extremities:      Previously seen                         (4CH, axis, and                         situs)  RVOT:                  Appears normal         Lower Extremities:      Previously seen ---------------------------------------------------------------------- Impression  Gestational diabetes. Patient reports she takes glyburide 5  mg in the morning and 2.5 mg in the evening. She  reports her  fasting levels range between 85 and 91 mg/dL and  postprandial levels (2-hour) are below 130 mg/dL.  Her blood pressures have been normal at prenatal visits.  On ultrasound, the estimated fetal weight is at greater than  the 99th percentile. Abdominal circumference measurement  is at greater than the 99th percentile. Amniotic fluid is normal  and good fetal activity is seen. Antenatal testing is  reassuring. BPP 8/8.  I explained the findings and the limitations of ultrasound in  accurately-estimating fetal weights. I encouraged her to make  a prenatal visit appointment today.  BP at our office: 116/69 mm Hg. ---------------------------------------------------------------------- Recommendations  -Continue weekly BPP till delivery.  -Patient to bring her blood glucose log at her next visit. ----------------------------------------------------------------------                  Tama High, MD Electronically Signed Final Report   04/05/2019 11:39 am ----------------------------------------------------------------------  [place] MFM OB FOLLOW UP  Result Date: 04/05/2019 ----------------------------------------------------------------------  OBSTETRICS REPORT                       (Signed Final 04/05/2019 11:39 am) ---------------------------------------------------------------------- Patient Info  ID #:       [id number]                          D.O.B.:  27-Mar-1980 (38 yrs)  Name:       Catherine Houston                      Visit Date: 04/05/2019 08:37 am ---------------------------------------------------------------------- Performed By  Performed By:  Jovancia Adrien        Ref. Address:     [address]                    [place], [place]  Attending:        Tama High MD        [facility]  Referred By:      Chancy Milroy                    MD ---------------------------------------------------------------------- Orders   #  Description                          Code         Ordered By   1  [place] MFM FETAL BPP WO NON              76819.01     [place]   2  [place] MFM OB FOLLOW UP                  [id number]     Sander Nephew  ----------------------------------------------------------------------   #  Order #                    Accession #  Episode #   1  [id number]                  [id number]                  [id number]   2  [id number]                  [id number]                  [id number]  ---------------------------------------------------------------------- Indications   Antenatal follow-up for nonvisualized fetal    Z36.2   anatomy   Gestational diabetes in pregnancy,             O24.415   controlled by oral hypoglycemic drugs   Poor obstetric history: Previous gestational   O09.299   diabetes   Low-risk NIPS   Previous pregnancy complicated by              O09.299   chromosomal abnormality, 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 Cord Insertion:      Previously Visualized  Amniotic Fluid  AFI FV:      Within normal limits  AFI Sum(cm)     %Tile       Largest Pocket(cm)  12.01           32          4.95  RUQ(cm)       RLQ(cm)       LUQ(cm)        LLQ(cm)  2.98          2.08          4.95           2 ---------------------------------------------------------------------- Biophysical Evaluation   Amniotic F.V:   Within normal limits       F. Tone:        Observed  F. Movement:    Observed                   Score:          8/8  F. Breathing:   Observed ---------------------------------------------------------------------- Biometry  BPD:      84.8  mm     G. Age:  34w 1d         87  %    CI:        77.29   %    70 - 86                                                          FL/HC:      22.1   %    19.1 - 21.3  HC:      305.4  mm     G. Age:  34w 67d  55  %    HC/AC:      0.93        0.96 - 1.17  AC:      327.6  mm     G. Age:  36w 5d       > 99  %    FL/BPD:     79.6   %    71 - 87  FL:       67.5  mm     G. Age:  34w 5d         90  %    FL/AC:      20.6   %    20 - 24  HUM:      55.5  mm     G. Age:  32w 2d         51  %  LV:        2.1  mm  Est. FW:    2719  gm           6 lb   > 99  % ---------------------------------------------------------------------- OB History  Gravidity:    3         Term:   2  Living:       2 ---------------------------------------------------------------------- Gestational Age  LMP:           32w 3d        Date:  08/21/18                 EDD:   05/28/19  U/S Today:     34w 6d                                        EDD:   05/11/19  Best:          Catherine Houston 3d     Det. By:  LMP  (08/21/18)          EDD:   05/28/19 ---------------------------------------------------------------------- Anatomy  Cranium:               Appears normal         LVOT:                   Previously seen  Cavum:                 Appears normal         Aortic Arch:            Previously seen  Ventricles:            Appears normal         Ductal Arch:            Previously seen  Choroid Plexus:        Previously seen        Diaphragm:              Appears normal  Cerebellum:            Previously seen        Stomach:                Appears normal, left  sided  Posterior Fossa:       Previously seen        Abdomen:                Appears normal   Nuchal Fold:           Not applicable (Q000111Q    Abdominal Wall:         Previously seen                         wks GA)  Face:                  Orbits and profile     Cord Vessels:           Previously seen                         previously seen  Lips:                  Previously seen        Kidneys:                Previously seen  Palate:                Previously seen        Bladder:                Appears normal  Thoracic:              Appears normal         Spine:                  Previously seen  Heart:                 Appears normal         Upper Extremities:      Previously seen                         (4CH, axis, and                         situs)  RVOT:                  Appears normal         Lower Extremities:      Previously seen ---------------------------------------------------------------------- Impression  Gestational diabetes. Patient reports she takes glyburide 5  mg in the morning and 2.5 mg in the evening. She reports her  fasting levels range between 85 and 91 mg/dL and  postprandial levels (2-hour) are below 130 mg/dL.  Her blood pressures have been normal at prenatal visits.  On ultrasound, the estimated fetal weight is at greater than  the 99th percentile. Abdominal circumference measurement  is at greater than the 99th percentile. Amniotic fluid is normal  and good fetal activity is seen. Antenatal testing is  reassuring. BPP 8/8.  I explained the findings and the limitations of ultrasound in  accurately-estimating fetal weights. I encouraged her to make  a prenatal visit appointment today.  BP at our office: 116/69 mm Hg. ---------------------------------------------------------------------- Recommendations  -Continue weekly BPP till delivery.  -Patient to bring her blood glucose log at her next visit. ----------------------------------------------------------------------                  Tama High, MD Electronically Signed Final Report  04/05/2019 11:39 am  ----------------------------------------------------------------------  [place] MFM OB FOLLOW UP  Result Date: 03/07/2019 ----------------------------------------------------------------------  OBSTETRICS REPORT                    (Corrected Final 03/07/2019 09:24 am) ---------------------------------------------------------------------- Patient Info  ID #:       [id number]                          D.O.B.:  1981-01-04 (38 yrs)  Name:       Catherine Houston                      Visit Date: 03/07/2019 08:55 am ---------------------------------------------------------------------- Performed By  Performed By:     Rodrigo Ran BS      Ref. Address:     [address]                    [place]                                                             [place], [place]  Attending:        Sander Nephew      [facility]                                       [facility]  Referred By:      Chancy Milroy                    MD ---------------------------------------------------------------------- Orders   #  Description                          Code         Ordered By   1  [place] MFM OB FOLLOW UP                  [id number]     Tama High  ----------------------------------------------------------------------   #  Order #                    Accession #                 Episode #   1  [id number]                  [id number]                  [id number]  ---------------------------------------------------------------------- Indications   Antenatal follow-up for nonvisualized fetal    Z36.2   anatomy   Gestational diabetes in pregnancy,             O24.415   controlled by oral hypoglycemic drugs   Poor obstetric history: Previous gestational   O09.299   diabetes   Low-risk NIPS   Previous pregnancy complicated by              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 Cord Insertion:      Visualized  Amniotic Fluid  AFI FV:      Within normal limits  AFI Sum(cm)     %Tile       Largest Pocket(cm)  12.52           32          4.49  RUQ(cm)                     LUQ(cm)        LLQ(cm)  4.49                        4.43           3.6 ---------------------------------------------------------------------- Biometry  BPD:      75.6  mm     G. Age:  30w 2d         92  %    CI:        71.71   %    70 - 86                                                          FL/HC:      18.4   %    18.8 - 20.6  HC:      284.2  mm     G. Age:  31w 1d         94  %    HC/AC:      1.08        1.05 - 1.21  AC:      263.6  mm     G. Age:  30w 3d         94  %    FL/BPD:     69.0   %    71 - 87  FL:       52.2  mm     G. Age:  27w 6d         22  %  FL/AC:      19.8   %    20 - 24  HUM:      47.7  mm     G. Age:  28w 0d         40  %  Est. FW:    1438  gm      3 lb 3 oz     87  % ---------------------------------------------------------------------- OB History  Gravidity:    3         Term:   2  Living:       2 ---------------------------------------------------------------------- Gestational Age  LMP:           28w 2d        Date:  08/21/18                 EDD:   05/28/19  U/S Today:     30w 0d                                        EDD:   05/16/19  Best:          28w 2d     Det. By:  LMP  (08/21/18)          EDD:   05/28/19 ---------------------------------------------------------------------- Anatomy  Cranium:               Appears normal         Aortic Arch:            Previously seen  Cavum:                 Appears normal         Ductal Arch:             Previously seen  Ventricles:            Appears normal         Diaphragm:              Appears normal  Choroid Plexus:        Appears normal         Stomach:                Appears normal, left                                                                        sided  Cerebellum:            Appears normal         Abdomen:                Previously seen  Posterior Fossa:       Appears normal         Abdominal Wall:         Previously seen  Nuchal Fold:           Previously seen        Cord Vessels:           Appears normal (3  vessel cord)  Face:                  Appears normal         Kidneys:                Appear normal                         (orbits and profile)  Lips:                  Appears normal         Bladder:                Appears normal  Thoracic:              Appears normal         Spine:                  Previously seen  Heart:                 Appears normal         Upper Extremities:      Previously seen                         (4CH, axis, and                         situs)  RVOT:                  Not well visualized    Lower Extremities:      Previously seen  LVOT:                  Appears normal ---------------------------------------------------------------------- Cervix Uterus Adnexa  Cervix  Not visualized (advanced GA >24wks)  Uterus  No abnormality visualized.  Left Ovary  Not visualized.  Right Ovary  Within normal limits.  Cul De Sac  No free fluid seen.  Adnexa  No abnormality visualized. ---------------------------------------------------------------------- Impression  A2GDM  Normal interval growth with good fetal movement and  amniotic fluid volume.  Lowr risk NIPS  Normal fetal echocardiogram ---------------------------------------------------------------------- Recommendations  Initiate weekly testing at 32 weeks.  Follow up growth in 5 weeks. ----------------------------------------------------------------------                     Sander Nephew, MD Electronically Signed Corrected Final Report  03/07/2019 09:24 am ----------------------------------------------------------------------   Assessment and Plan:  Pregnancy: G3P2002 at [redacted]w[redacted]d 1. Supervision of high risk pregnancy, antepartum Stable  2. Gestational diabetes mellitus (GDM) affecting pregnancy, antepartum CBG's in goal range with current treatment Growth scan yesterday > 99%, follow up growth scan ordered in 4 weeks Starting weekly BPP   3. Hereditary disease in family possibly affecting fetus, affecting management of mother, antepartum condition or complication, not applicable or unspecified fetus Stable  Preterm labor symptoms and general obstetric precautions including but not limited to vaginal bleeding, contractions, leaking of fluid and fetal movement were reviewed in detail with the patient. I discussed the assessment and treatment plan with the patient. The patient was provided an opportunity to ask questions and all were answered. The patient agreed with the plan and demonstrated an understanding of the instructions. The patient was advised to call back or seek an in-person office evaluation/go to [facility] at [facility] for any urgent or concerning symptoms. Please refer  to After Visit Summary for other counseling recommendations.   I provided 8 minutes of face-to-face time during this encounter.  Return in about 2 weeks (around 04/20/2019) for OB visit, virtual, MD provider.  Future Appointments  Date Time Provider [place]  04/12/2019  7:45 AM [place] MFC-US  04/12/2019  7:45 AM [place] 2 WH-MFCUS MFC-US  04/19/2019  8:00 AM [place] NURSE [place] MFC-US  04/19/2019  8:00 AM [place] 3 WH-MFCUS MFC-US  04/25/2019  8:15 AM WH-MFC NURSE WH-MFC MFC-US  04/25/2019  8:15 AM WH-MFC [place] 4 WH-MFCUS MFC-US  05/03/2019  7:30 AM [place] NURSE [place] MFC-US  05/03/2019  7:30 AM [place] 1 WH-MFCUS MFC-US     Chancy Milroy, [place] for [facility], [facility]

## 2019-04-12 ENCOUNTER — Ambulatory Visit (HOSPITAL_COMMUNITY): Payer: Medicaid Other | Admitting: *Deleted

## 2019-04-12 ENCOUNTER — Ambulatory Visit (HOSPITAL_COMMUNITY)
Admission: RE | Admit: 2019-04-12 | Discharge: 2019-04-12 | Disposition: A | Discharge status: Home / Self Care | Payer: Medicaid Other | Source: Ambulatory Visit | Attending: Obstetrics and Gynecology | Admitting: Obstetrics and Gynecology

## 2019-04-12 ENCOUNTER — Other Ambulatory Visit: Payer: Self-pay

## 2019-04-12 ENCOUNTER — Encounter (HOSPITAL_COMMUNITY): Payer: Self-pay

## 2019-04-12 DIAGNOSIS — O0990 Supervision of high risk pregnancy, unspecified, unspecified trimester: Secondary | ICD-10-CM | POA: Insufficient documentation

## 2019-04-12 DIAGNOSIS — Z3A33 33 weeks gestation of pregnancy: Secondary | ICD-10-CM | POA: Diagnosis not present

## 2019-04-12 DIAGNOSIS — O09293 Supervision of pregnancy with other poor reproductive or obstetric history, third trimester: Secondary | ICD-10-CM | POA: Diagnosis not present

## 2019-04-12 DIAGNOSIS — O24415 Gestational diabetes mellitus in pregnancy, controlled by oral hypoglycemic drugs: Secondary | ICD-10-CM | POA: Diagnosis present

## 2019-04-12 DIAGNOSIS — Z362 Encounter for other antenatal screening follow-up: Secondary | ICD-10-CM | POA: Diagnosis not present

## 2019-04-18 ENCOUNTER — Other Ambulatory Visit: Payer: Self-pay

## 2019-04-18 DIAGNOSIS — O0990 Supervision of high risk pregnancy, unspecified, unspecified trimester: Secondary | ICD-10-CM

## 2019-04-18 MED ORDER — MISC. DEVICES MISC: 0 refills | Status: DC

## 2019-04-19 ENCOUNTER — Ambulatory Visit (HOSPITAL_COMMUNITY)
Admission: RE | Admit: 2019-04-19 | Discharge: 2019-04-19 | Disposition: A | Discharge status: Home / Self Care | Payer: Medicaid Other | Source: Ambulatory Visit | Attending: Obstetrics and Gynecology | Admitting: Obstetrics and Gynecology

## 2019-04-19 ENCOUNTER — Ambulatory Visit (HOSPITAL_COMMUNITY): Payer: Medicaid Other | Admitting: *Deleted

## 2019-04-19 ENCOUNTER — Other Ambulatory Visit: Payer: Self-pay

## 2019-04-19 ENCOUNTER — Encounter (HOSPITAL_COMMUNITY): Payer: Self-pay

## 2019-04-19 DIAGNOSIS — O09293 Supervision of pregnancy with other poor reproductive or obstetric history, third trimester: Secondary | ICD-10-CM

## 2019-04-19 DIAGNOSIS — O0990 Supervision of high risk pregnancy, unspecified, unspecified trimester: Secondary | ICD-10-CM

## 2019-04-19 DIAGNOSIS — Z3A34 34 weeks gestation of pregnancy: Secondary | ICD-10-CM | POA: Diagnosis not present

## 2019-04-19 DIAGNOSIS — O24415 Gestational diabetes mellitus in pregnancy, controlled by oral hypoglycemic drugs: Secondary | ICD-10-CM | POA: Diagnosis present

## 2019-04-20 ENCOUNTER — Encounter: Payer: Self-pay | Admitting: Obstetrics and Gynecology

## 2019-04-20 ENCOUNTER — Telehealth (INDEPENDENT_AMBULATORY_CARE_PROVIDER_SITE_OTHER): Payer: Medicaid Other | Admitting: Obstetrics and Gynecology

## 2019-04-20 DIAGNOSIS — Z3A34 34 weeks gestation of pregnancy: Secondary | ICD-10-CM

## 2019-04-20 DIAGNOSIS — O352XX0 Maternal care for (suspected) hereditary disease in fetus, not applicable or unspecified: Secondary | ICD-10-CM

## 2019-04-20 DIAGNOSIS — O24419 Gestational diabetes mellitus in pregnancy, unspecified control: Secondary | ICD-10-CM

## 2019-04-20 DIAGNOSIS — O0990 Supervision of high risk pregnancy, unspecified, unspecified trimester: Secondary | ICD-10-CM

## 2019-04-20 DIAGNOSIS — B009 Herpesviral infection, unspecified: Secondary | ICD-10-CM

## 2019-04-20 NOTE — Progress Notes (Signed)
I connected with  Milda Smart on 04/20/19 at  9:00 AM EDT by telephone and verified that I am speaking with the correct person using two identifiers.   I discussed the limitations, risks, security and privacy concerns of performing an evaluation and management service by telephone and the availability of in person appointments. I also discussed with the patient that there may be a patient responsible charge related to this service. The patient expressed understanding and agreed to proceed.  Quinita Kostelecky Jeanella Anton, CMA 04/20/2019  8:49 AM  Fasting: 97

## 2019-04-20 NOTE — Progress Notes (Signed)
Subjective:  Catherine Houston is a 39 y.o. G3P2002 at [redacted]w[redacted]d being seen today for ongoing prenatal care.  She is currently monitored for the following issues for this high-risk pregnancy and has Hereditary disease in family possibly affecting fetus, affecting management of mother, antepartum condition or complication, not applicable or unspecified fetus; History of genital HSV-1 infection; BMI 31.0-31.9,adult; Gestational diabetes mellitus (GDM) affecting pregnancy, antepartum; History of gestational diabetes mellitus (GDM); and Supervision of high risk pregnancy, antepartum on their problem list.  Patient reports no complaints.  Contractions: Not present.  .  Movement: Present. Denies leaking of fluid.   The following portions of the patient's history were reviewed and updated as appropriate: allergies, current medications, past family history, past medical history, past social history, past surgical history and problem list. Problem list updated.  Objective:  There were no vitals filed for this visit.  Fetal Status:     Movement: Present     General:  Alert, oriented and cooperative. Patient is in no acute distress.  Skin: Skin is warm and dry. No rash noted.   Cardiovascular: Normal heart rate noted  Respiratory: Normal respiratory effort, no problems with respiration noted  Abdomen: Soft, gravid, appropriate for gestational age. Pain/Pressure: Present     Pelvic:  Cervical exam deferred        Extremities: Normal range of motion.  Edema: None  Mental Status: Normal mood and affect. Normal behavior. Normal judgment and thought content.   Urinalysis:      Assessment and Plan:  Pregnancy: G3P2002 at [redacted]w[redacted]d  1. Supervision of high risk pregnancy, antepartum Stable GBS next visit  2. Gestational diabetes mellitus (GDM) affecting pregnancy, antepartum Pt recording CBG's on paper log Reports in goal range BPP 8/8 yesterday Continue with bid Glyburide and weekly antenatal testing  3.  Hereditary disease in family possibly affecting fetus, affecting management of mother, antepartum condition or complication, not applicable or unspecified fetus LR NIPS, AFP negative  4. History of genital HSV-1 infection Discuss suppression at next OB appt  Preterm labor symptoms and general obstetric precautions including but not limited to vaginal bleeding, contractions, leaking of fluid and fetal movement were reviewed in detail with the patient. Please refer to After Visit Summary for other counseling recommendations.  Return in about 2 weeks (around 05/04/2019) for OB visit, face to face, MD provider, GBS.   Chancy Milroy, MD

## 2019-04-25 ENCOUNTER — Other Ambulatory Visit: Payer: Self-pay

## 2019-04-25 ENCOUNTER — Encounter (HOSPITAL_COMMUNITY): Payer: Self-pay

## 2019-04-25 ENCOUNTER — Ambulatory Visit (HOSPITAL_COMMUNITY)
Admission: RE | Admit: 2019-04-25 | Discharge: 2019-04-25 | Disposition: A | Discharge status: Home / Self Care | Payer: Medicaid Other | Source: Ambulatory Visit | Attending: Obstetrics and Gynecology | Admitting: Obstetrics and Gynecology

## 2019-04-25 ENCOUNTER — Ambulatory Visit (HOSPITAL_COMMUNITY): Payer: Medicaid Other | Admitting: *Deleted

## 2019-04-25 DIAGNOSIS — O0990 Supervision of high risk pregnancy, unspecified, unspecified trimester: Secondary | ICD-10-CM | POA: Diagnosis present

## 2019-04-25 DIAGNOSIS — Z3A35 35 weeks gestation of pregnancy: Secondary | ICD-10-CM

## 2019-04-25 DIAGNOSIS — O24415 Gestational diabetes mellitus in pregnancy, controlled by oral hypoglycemic drugs: Secondary | ICD-10-CM | POA: Diagnosis present

## 2019-04-25 DIAGNOSIS — O09299 Supervision of pregnancy with other poor reproductive or obstetric history, unspecified trimester: Secondary | ICD-10-CM | POA: Diagnosis not present

## 2019-05-03 ENCOUNTER — Encounter: Payer: Medicaid Other | Admitting: Obstetrics & Gynecology

## 2019-05-03 ENCOUNTER — Other Ambulatory Visit (HOSPITAL_COMMUNITY): Payer: Self-pay | Admitting: *Deleted

## 2019-05-03 ENCOUNTER — Ambulatory Visit (HOSPITAL_COMMUNITY): Payer: Medicaid Other | Admitting: *Deleted

## 2019-05-03 ENCOUNTER — Other Ambulatory Visit: Payer: Self-pay

## 2019-05-03 ENCOUNTER — Ambulatory Visit (HOSPITAL_COMMUNITY)
Admission: RE | Admit: 2019-05-03 | Discharge: 2019-05-03 | Disposition: A | Discharge status: Home / Self Care | Payer: Medicaid Other | Source: Ambulatory Visit | Attending: Obstetrics and Gynecology | Admitting: Obstetrics and Gynecology

## 2019-05-03 ENCOUNTER — Encounter (HOSPITAL_COMMUNITY): Payer: Self-pay

## 2019-05-03 VITALS — BP 119/75 | HR 77 | Temp 97.3°F

## 2019-05-03 DIAGNOSIS — O24415 Gestational diabetes mellitus in pregnancy, controlled by oral hypoglycemic drugs: Secondary | ICD-10-CM | POA: Diagnosis present

## 2019-05-03 DIAGNOSIS — O09299 Supervision of pregnancy with other poor reproductive or obstetric history, unspecified trimester: Secondary | ICD-10-CM

## 2019-05-03 DIAGNOSIS — O09293 Supervision of pregnancy with other poor reproductive or obstetric history, third trimester: Secondary | ICD-10-CM

## 2019-05-03 DIAGNOSIS — O0990 Supervision of high risk pregnancy, unspecified, unspecified trimester: Secondary | ICD-10-CM | POA: Diagnosis present

## 2019-05-03 DIAGNOSIS — O99213 Obesity complicating pregnancy, third trimester: Secondary | ICD-10-CM

## 2019-05-03 DIAGNOSIS — O09523 Supervision of elderly multigravida, third trimester: Secondary | ICD-10-CM

## 2019-05-03 DIAGNOSIS — Z3A36 36 weeks gestation of pregnancy: Secondary | ICD-10-CM

## 2019-05-08 ENCOUNTER — Encounter: Payer: Medicaid Other | Admitting: Obstetrics & Gynecology

## 2019-05-09 ENCOUNTER — Encounter: Payer: Self-pay | Admitting: Obstetrics & Gynecology

## 2019-05-09 ENCOUNTER — Ambulatory Visit (INDEPENDENT_AMBULATORY_CARE_PROVIDER_SITE_OTHER): Payer: Medicaid Other | Admitting: Obstetrics & Gynecology

## 2019-05-09 ENCOUNTER — Other Ambulatory Visit: Payer: Self-pay | Admitting: Obstetrics & Gynecology

## 2019-05-09 ENCOUNTER — Other Ambulatory Visit (HOSPITAL_COMMUNITY)
Admission: RE | Admit: 2019-05-09 | Discharge: 2019-05-09 | Disposition: A | Discharge status: Home / Self Care | Payer: Medicaid Other | Source: Ambulatory Visit | Attending: Obstetrics & Gynecology | Admitting: Obstetrics & Gynecology

## 2019-05-09 ENCOUNTER — Other Ambulatory Visit: Payer: Self-pay

## 2019-05-09 ENCOUNTER — Telehealth (HOSPITAL_COMMUNITY): Payer: Self-pay | Admitting: *Deleted

## 2019-05-09 ENCOUNTER — Encounter (HOSPITAL_COMMUNITY): Payer: Self-pay | Admitting: *Deleted

## 2019-05-09 VITALS — BP 119/85 | HR 66 | Wt 197.0 lb

## 2019-05-09 DIAGNOSIS — O0990 Supervision of high risk pregnancy, unspecified, unspecified trimester: Secondary | ICD-10-CM

## 2019-05-09 DIAGNOSIS — Z3A37 37 weeks gestation of pregnancy: Secondary | ICD-10-CM

## 2019-05-09 DIAGNOSIS — O0993 Supervision of high risk pregnancy, unspecified, third trimester: Secondary | ICD-10-CM

## 2019-05-09 DIAGNOSIS — O24419 Gestational diabetes mellitus in pregnancy, unspecified control: Secondary | ICD-10-CM

## 2019-05-09 NOTE — Patient Instructions (Signed)

## 2019-05-09 NOTE — Progress Notes (Signed)
ROB  CC: None   Pt request cervix check.

## 2019-05-09 NOTE — Progress Notes (Signed)
   PRENATAL VISIT NOTE  Subjective:  Catherine Houston is a 39 y.o. G3P2002 at [redacted]w[redacted]d being seen today for ongoing prenatal care.  She is currently monitored for the following issues for this high-risk pregnancy and has Hereditary disease in family possibly affecting fetus, affecting management of mother, antepartum condition or complication, not applicable or unspecified fetus; History of genital HSV-1 infection; BMI 31.0-31.9,adult; Gestational diabetes mellitus (GDM) affecting pregnancy, antepartum; History of gestational diabetes mellitus (GDM); and Supervision of high risk pregnancy, antepartum on their problem list.  Patient reports occasional contractions.  Contractions: Not present. Vag. Bleeding: None.  Movement: Present. Denies leaking of fluid.   The following portions of the patient's history were reviewed and updated as appropriate: allergies, current medications, past family history, past medical history, past social history, past surgical history and problem list.   Objective:   Vitals:   05/09/19 0829  BP: 119/85  Pulse: 66  Weight: 197 lb (89.4 kg)    Fetal Status: Fetal Heart Rate (bpm): 138   Movement: Present  Presentation: Vertex  General:  Alert, oriented and cooperative. Patient is in no acute distress.  Skin: Skin is warm and dry. No rash noted.   Cardiovascular: Normal heart rate noted  Respiratory: Normal respiratory effort, no problems with respiration noted  Abdomen: Soft, gravid, appropriate for gestational age.  Pain/Pressure: Present     Pelvic: Cervical exam performed in the presence of a chaperone Dilation: 1.5 Effacement (%): 30 Station: -3  Extremities: Normal range of motion.  Edema: None  Mental Status: Normal mood and affect. Normal behavior. Normal judgment and thought content.   Assessment and Plan:  Pregnancy: G3P2002 at [redacted]w[redacted]d 1. Supervision of high risk pregnancy, antepartum routine - Strep Gp B NAA - Cervicovaginal ancillary only( [facility])  2. Gestational diabetes mellitus (GDM) affecting pregnancy, antepartum IOL 39 weeks. Suspect macrosomia, pelvis tested to 8-12  Term labor symptoms and general obstetric precautions including but not limited to vaginal bleeding, contractions, leaking of fluid and fetal movement were reviewed in detail with the patient. Please refer to After Visit Summary for other counseling recommendations.   Return in about 1 week (around 05/16/2019) for virtual.  Future Appointments  Date Time Provider [place]  05/10/2019  4:00 PM [place] [facility]-US  05/10/2019  4:00 PM WH-MFC [place] 3 WH-MFCUS MFC-US  05/16/2019 10:00 AM [place] Mode, MD [facility] None  05/17/2019 10:45 AM WH-MFC NURSE WH-MFC MFC-US  05/17/2019 10:45 AM WH-MFC [place] 5 WH-MFCUS MFC-US    Emeterio Reeve, MD

## 2019-05-09 NOTE — Telephone Encounter (Signed)
Preadmission screen  

## 2019-05-10 ENCOUNTER — Ambulatory Visit (HOSPITAL_COMMUNITY): Payer: Medicaid Other | Admitting: *Deleted

## 2019-05-10 ENCOUNTER — Ambulatory Visit (HOSPITAL_COMMUNITY)
Admission: RE | Admit: 2019-05-10 | Discharge: 2019-05-10 | Disposition: A | Discharge status: Home / Self Care | Payer: Medicaid Other | Source: Ambulatory Visit | Attending: Obstetrics and Gynecology | Admitting: Obstetrics and Gynecology

## 2019-05-10 ENCOUNTER — Encounter (HOSPITAL_COMMUNITY): Payer: Self-pay

## 2019-05-10 DIAGNOSIS — O24415 Gestational diabetes mellitus in pregnancy, controlled by oral hypoglycemic drugs: Secondary | ICD-10-CM | POA: Insufficient documentation

## 2019-05-10 DIAGNOSIS — Z3A37 37 weeks gestation of pregnancy: Secondary | ICD-10-CM

## 2019-05-10 DIAGNOSIS — O0990 Supervision of high risk pregnancy, unspecified, unspecified trimester: Secondary | ICD-10-CM

## 2019-05-10 LAB — CERVICOVAGINAL ANCILLARY ONLY
Chlamydia: NEGATIVE
Comment: NEGATIVE
Comment: NORMAL
Neisseria Gonorrhea: NEGATIVE

## 2019-05-11 LAB — STREP GP B NAA: Strep Gp B NAA: POSITIVE — AB

## 2019-05-16 ENCOUNTER — Other Ambulatory Visit: Payer: Self-pay | Admitting: Advanced Practice Midwife

## 2019-05-16 ENCOUNTER — Telehealth (INDEPENDENT_AMBULATORY_CARE_PROVIDER_SITE_OTHER): Payer: Medicaid Other | Admitting: Obstetrics & Gynecology

## 2019-05-16 VITALS — BP 131/93 | HR 75

## 2019-05-16 DIAGNOSIS — O24419 Gestational diabetes mellitus in pregnancy, unspecified control: Secondary | ICD-10-CM | POA: Diagnosis not present

## 2019-05-16 DIAGNOSIS — O0990 Supervision of high risk pregnancy, unspecified, unspecified trimester: Secondary | ICD-10-CM

## 2019-05-16 DIAGNOSIS — O0993 Supervision of high risk pregnancy, unspecified, third trimester: Secondary | ICD-10-CM | POA: Diagnosis not present

## 2019-05-16 DIAGNOSIS — Z3A38 38 weeks gestation of pregnancy: Secondary | ICD-10-CM | POA: Diagnosis not present

## 2019-05-16 NOTE — Progress Notes (Signed)
   TELEHEALTH VIRTUAL OBSTETRICS VISIT ENCOUNTER NOTE  I connected with Catherine Houston on 05/16/19 at 10:00 AM EDT by telephone at home and verified that I am speaking with the correct person using two identifiers.   I discussed the limitations, risks, security and privacy concerns of performing an evaluation and management service by telephone and the availability of in person appointments. I also discussed with the patient that there may be a patient responsible charge related to this service. The patient expressed understanding and agreed to proceed.  Subjective:  Catherine Houston is a 39 y.o. G3P2002 at [redacted]w[redacted]d being followed for ongoing prenatal care.  She is currently monitored for the following issues for this high-risk pregnancy and has Hereditary disease in family possibly affecting fetus, affecting management of mother, antepartum condition or complication, not applicable or unspecified fetus; History of genital HSV-1 infection; BMI 31.0-31.9,adult; Gestational diabetes mellitus (GDM) affecting pregnancy, antepartum; History of gestational diabetes mellitus (GDM); and Supervision of high risk pregnancy, antepartum on their problem list.  Patient reports no complaints. Reports fetal movement. Denies any contractions, bleeding or leaking of fluid.   The following portions of the patient's history were reviewed and updated as appropriate: allergies, current medications, past family history, past medical history, past social history, past surgical history and problem list.   Objective:   General:  Alert, oriented and cooperative.   Mental Status: Normal mood and affect perceived. Normal judgment and thought content.  Rest of physical exam deferred due to type of encounter  Assessment and Plan:  Pregnancy: G3P2002 at [redacted]w[redacted]d 1. Supervision of high risk pregnancy, antepartum Repeat BP 115/89. IOL 39 weeks  2. Gestational diabetes mellitus (GDM) affecting pregnancy, antepartum FBS<80, PP <124  Term  labor symptoms and general obstetric precautions including but not limited to vaginal bleeding, contractions, leaking of fluid and fetal movement were reviewed in detail with the patient.  I discussed the assessment and treatment plan with the patient. The patient was provided an opportunity to ask questions and all were answered. The patient agreed with the plan and demonstrated an understanding of the instructions. The patient was advised to call back or seek an in-person office evaluation/go to [facility] at [facility] for any urgent or concerning symptoms. Please refer to After Visit Summary for other counseling recommendations.   I provided 12 minutes of non-face-to-face time during this encounter.  Return in about 4 weeks (around 06/13/2019) for postpartum.  Future Appointments  Date Time Provider [place]  05/17/2019 10:45 AM [place] [facility]-US  05/17/2019 10:45 AM WH-MFC [place] 5 WH-MFCUS MFC-US  05/19/2019  9:25 AM MC-SCREENING MC-SDSC None  05/21/2019  7:15 AM MC-LD [place] MC-INDC None    Emeterio Reeve, MD [facility] [place]

## 2019-05-16 NOTE — Patient Instructions (Signed)

## 2019-05-17 ENCOUNTER — Other Ambulatory Visit: Payer: Self-pay

## 2019-05-17 ENCOUNTER — Ambulatory Visit (HOSPITAL_COMMUNITY): Payer: Medicaid Other | Admitting: *Deleted

## 2019-05-17 ENCOUNTER — Ambulatory Visit (HOSPITAL_COMMUNITY)
Admission: RE | Admit: 2019-05-17 | Discharge: 2019-05-17 | Disposition: A | Discharge status: Home / Self Care | Payer: Medicaid Other | Source: Ambulatory Visit | Attending: Obstetrics and Gynecology | Admitting: Obstetrics and Gynecology

## 2019-05-17 ENCOUNTER — Encounter (HOSPITAL_COMMUNITY): Payer: Self-pay

## 2019-05-17 DIAGNOSIS — O24415 Gestational diabetes mellitus in pregnancy, controlled by oral hypoglycemic drugs: Secondary | ICD-10-CM | POA: Diagnosis present

## 2019-05-17 DIAGNOSIS — O0990 Supervision of high risk pregnancy, unspecified, unspecified trimester: Secondary | ICD-10-CM | POA: Diagnosis present

## 2019-05-17 DIAGNOSIS — Z3A38 38 weeks gestation of pregnancy: Secondary | ICD-10-CM

## 2019-05-19 ENCOUNTER — Other Ambulatory Visit (HOSPITAL_COMMUNITY)
Admission: RE | Admit: 2019-05-19 | Discharge: 2019-05-19 | Disposition: A | Discharge status: Home / Self Care | Payer: Medicaid Other | Source: Ambulatory Visit | Attending: Family Medicine | Admitting: Family Medicine

## 2019-05-19 DIAGNOSIS — Z20822 Contact with and (suspected) exposure to covid-19: Secondary | ICD-10-CM | POA: Diagnosis not present

## 2019-05-19 DIAGNOSIS — Z01812 Encounter for preprocedural laboratory examination: Secondary | ICD-10-CM | POA: Diagnosis present

## 2019-05-20 LAB — SARS CORONAVIRUS 2 (TAT 6-24 HRS): SARS Coronavirus 2: NEGATIVE

## 2019-05-21 ENCOUNTER — Inpatient Hospital Stay (HOSPITAL_COMMUNITY)
Admission: AD | Admit: 2019-05-21 | Discharge: 2019-05-23 | DRG: 807 | Disposition: A | Discharge status: Home / Self Care | Payer: Medicaid Other | Attending: Obstetrics and Gynecology | Admitting: Obstetrics and Gynecology

## 2019-05-21 ENCOUNTER — Inpatient Hospital Stay (HOSPITAL_COMMUNITY): Payer: Medicaid Other

## 2019-05-21 ENCOUNTER — Encounter (HOSPITAL_COMMUNITY): Payer: Self-pay | Admitting: Obstetrics & Gynecology

## 2019-05-21 ENCOUNTER — Inpatient Hospital Stay (HOSPITAL_COMMUNITY): Payer: Medicaid Other | Admitting: Anesthesiology

## 2019-05-21 ENCOUNTER — Other Ambulatory Visit: Payer: Self-pay

## 2019-05-21 DIAGNOSIS — O98313 Other infections with a predominantly sexual mode of transmission complicating pregnancy, third trimester: Secondary | ICD-10-CM

## 2019-05-21 DIAGNOSIS — O99824 Streptococcus B carrier state complicating childbirth: Secondary | ICD-10-CM | POA: Diagnosis present

## 2019-05-21 DIAGNOSIS — O99214 Obesity complicating childbirth: Secondary | ICD-10-CM | POA: Diagnosis present

## 2019-05-21 DIAGNOSIS — O139 Gestational [pregnancy-induced] hypertension without significant proteinuria, unspecified trimester: Secondary | ICD-10-CM

## 2019-05-21 DIAGNOSIS — O352XX0 Maternal care for (suspected) hereditary disease in fetus, not applicable or unspecified: Secondary | ICD-10-CM | POA: Diagnosis present

## 2019-05-21 DIAGNOSIS — O24419 Gestational diabetes mellitus in pregnancy, unspecified control: Secondary | ICD-10-CM | POA: Diagnosis present

## 2019-05-21 DIAGNOSIS — E669 Obesity, unspecified: Secondary | ICD-10-CM | POA: Diagnosis present

## 2019-05-21 DIAGNOSIS — B009 Herpesviral infection, unspecified: Secondary | ICD-10-CM | POA: Diagnosis present

## 2019-05-21 DIAGNOSIS — O24425 Gestational diabetes mellitus in childbirth, controlled by oral hypoglycemic drugs: Principal | ICD-10-CM | POA: Diagnosis present

## 2019-05-21 DIAGNOSIS — O133 Gestational [pregnancy-induced] hypertension without significant proteinuria, third trimester: Secondary | ICD-10-CM

## 2019-05-21 DIAGNOSIS — Z3A39 39 weeks gestation of pregnancy: Secondary | ICD-10-CM

## 2019-05-21 DIAGNOSIS — O24415 Gestational diabetes mellitus in pregnancy, controlled by oral hypoglycemic drugs: Secondary | ICD-10-CM

## 2019-05-21 DIAGNOSIS — Z8632 Personal history of gestational diabetes: Secondary | ICD-10-CM | POA: Diagnosis present

## 2019-05-21 DIAGNOSIS — O134 Gestational [pregnancy-induced] hypertension without significant proteinuria, complicating childbirth: Secondary | ICD-10-CM | POA: Diagnosis present

## 2019-05-21 DIAGNOSIS — O0990 Supervision of high risk pregnancy, unspecified, unspecified trimester: Secondary | ICD-10-CM

## 2019-05-21 DIAGNOSIS — O99820 Streptococcus B carrier state complicating pregnancy: Secondary | ICD-10-CM

## 2019-05-21 DIAGNOSIS — Z6831 Body mass index (BMI) 31.0-31.9, adult: Secondary | ICD-10-CM

## 2019-05-21 LAB — RPR: RPR Ser Ql: NONREACTIVE

## 2019-05-21 LAB — CBC
HCT: 40.3 % (ref 36.0–46.0)
Hemoglobin: 13.5 g/dL (ref 12.0–15.0)
MCH: 28.9 pg (ref 26.0–34.0)
MCHC: 33.5 g/dL (ref 30.0–36.0)
MCV: 86.3 fL (ref 80.0–100.0)
Platelets: 234 10*3/uL (ref 150–400)
RBC: 4.67 MIL/uL (ref 3.87–5.11)
RDW: 14 % (ref 11.5–15.5)
WBC: 9.1 10*3/uL (ref 4.0–10.5)
nRBC: 0 % (ref 0.0–0.2)

## 2019-05-21 LAB — COMPREHENSIVE METABOLIC PANEL
ALT: 16 U/L (ref 0–44)
AST: 20 U/L (ref 15–41)
Albumin: 2.9 g/dL — ABNORMAL LOW (ref 3.5–5.0)
Alkaline Phosphatase: 88 U/L (ref 38–126)
Anion gap: 10 (ref 5–15)
BUN: 13 mg/dL (ref 6–20)
CO2: 19 mmol/L — ABNORMAL LOW (ref 22–32)
Calcium: 9.2 mg/dL (ref 8.9–10.3)
Chloride: 106 mmol/L (ref 98–111)
Creatinine, Ser: 0.56 mg/dL (ref 0.44–1.00)
GFR calc Af Amer: >60 mL/min (ref >60)
GFR calc non Af Amer: >60 mL/min (ref >60)
Glucose, Bld: 97 mg/dL (ref 70–99)
Potassium: 3.9 mmol/L (ref 3.5–5.1)
Sodium: 135 mmol/L (ref 135–145)
Total Bilirubin: 0.4 mg/dL (ref 0.3–1.2)
Total Protein: 6.6 g/dL (ref 6.5–8.1)

## 2019-05-21 LAB — TYPE AND SCREEN
ABO/RH(D): AB POS
Antibody Screen: NEGATIVE

## 2019-05-21 LAB — PROTEIN / CREATININE RATIO, URINE
Creatinine, Urine: 55.53 mg/dL
Total Protein, Urine: <6 mg/dL

## 2019-05-21 LAB — GLUCOSE, CAPILLARY
Glucose-Capillary: 80 mg/dL (ref 70–99)
Glucose-Capillary: 73 mg/dL (ref 70–99)
Glucose-Capillary: 56 mg/dL — ABNORMAL LOW (ref 70–99)
Glucose-Capillary: 64 mg/dL — ABNORMAL LOW (ref 70–99)
Glucose-Capillary: 105 mg/dL — ABNORMAL HIGH (ref 70–99)

## 2019-05-21 LAB — ABO/RH: ABO/RH(D): AB POS

## 2019-05-21 MED ORDER — PENICILLIN G POT IN DEXTROSE 60000 UNIT/ML IV SOLN
3.0000 10*6.[IU] | INTRAVENOUS | Status: DC
  Administered 2019-05-21 (×3): 3 10*6.[IU] via INTRAVENOUS
  Filled 2019-05-21 (×3): qty 50

## 2019-05-21 MED ORDER — OXYTOCIN 40 UNITS IN NORMAL SALINE INFUSION - SIMPLE MED
1.0000 m[IU]/min | INTRAVENOUS | Status: DC
  Administered 2019-05-21: 2 m[IU]/min via INTRAVENOUS

## 2019-05-21 MED ORDER — LIDOCAINE HCL (PF) 1 % IJ SOLN
INTRAMUSCULAR | Status: DC | PRN
  Administered 2019-05-21: 5 mL via EPIDURAL

## 2019-05-21 MED ORDER — TERBUTALINE SULFATE 1 MG/ML IJ SOLN: 0.2500 mg | Freq: Once | INTRAMUSCULAR | Status: DC | PRN

## 2019-05-21 MED ORDER — EPHEDRINE 5 MG/ML INJ: 10.0000 mg | INTRAVENOUS | Status: DC | PRN

## 2019-05-21 MED ORDER — DOCUSATE SODIUM 100 MG PO CAPS
100.0000 mg | ORAL_CAPSULE | Freq: Two times a day (BID) | ORAL | Status: DC
  Administered 2019-05-21 – 2019-05-22 (×3): 100 mg via ORAL
  Filled 2019-05-21 (×3): qty 1

## 2019-05-21 MED ORDER — FENTANYL-BUPIVACAINE-NACL 0.5-0.125-0.9 MG/250ML-% EP SOLN
12.0000 mL/h | EPIDURAL | Status: DC | PRN
  Filled 2019-05-21: qty 250

## 2019-05-21 MED ORDER — SOD CITRATE-CITRIC ACID 500-334 MG/5ML PO SOLN
30.0000 mL | ORAL | Status: DC | PRN
  Administered 2019-05-21: 30 mL via ORAL
  Filled 2019-05-21: qty 30

## 2019-05-21 MED ORDER — LIDOCAINE HCL (PF) 1 % IJ SOLN: 30.0000 mL | INTRAMUSCULAR | Status: DC | PRN

## 2019-05-21 MED ORDER — DIBUCAINE (PERIANAL) 1 % EX OINT: 1.0000 "application " | TOPICAL_OINTMENT | CUTANEOUS | Status: DC | PRN

## 2019-05-21 MED ORDER — OXYCODONE-ACETAMINOPHEN 5-325 MG PO TABS: 2.0000 | ORAL_TABLET | ORAL | Status: DC | PRN

## 2019-05-21 MED ORDER — TETANUS-DIPHTH-ACELL PERTUSSIS 5-2.5-18.5 LF-MCG/0.5 IM SUSP: 0.5000 mL | Freq: Once | INTRAMUSCULAR | Status: DC

## 2019-05-21 MED ORDER — ONDANSETRON HCL 4 MG/2ML IJ SOLN: 4.0000 mg | Freq: Four times a day (QID) | INTRAMUSCULAR | Status: DC | PRN

## 2019-05-21 MED ORDER — SIMETHICONE 80 MG PO CHEW: 80.0000 mg | CHEWABLE_TABLET | ORAL | Status: DC | PRN

## 2019-05-21 MED ORDER — PRENATAL MULTIVITAMIN CH
1.0000 | ORAL_TABLET | Freq: Every day | ORAL | Status: DC
  Administered 2019-05-22 – 2019-05-23 (×2): 1 via ORAL
  Filled 2019-05-21 (×2): qty 1

## 2019-05-21 MED ORDER — LACTATED RINGERS IV SOLN: 500.0000 mL | INTRAVENOUS | Status: DC | PRN

## 2019-05-21 MED ORDER — IBUPROFEN 600 MG PO TABS
600.0000 mg | ORAL_TABLET | Freq: Four times a day (QID) | ORAL | Status: DC
  Administered 2019-05-21 – 2019-05-23 (×7): 600 mg via ORAL
  Filled 2019-05-21 (×7): qty 1

## 2019-05-21 MED ORDER — BENZOCAINE-MENTHOL 20-0.5 % EX AERO
1.0000 "application " | INHALATION_SPRAY | CUTANEOUS | Status: DC | PRN
  Filled 2019-05-21: qty 56

## 2019-05-21 MED ORDER — DIPHENHYDRAMINE HCL 50 MG/ML IJ SOLN: 12.5000 mg | INTRAMUSCULAR | Status: DC | PRN

## 2019-05-21 MED ORDER — PHENYLEPHRINE 40 MCG/ML (10ML) SYRINGE FOR IV PUSH (FOR BLOOD PRESSURE SUPPORT): 80.0000 ug | PREFILLED_SYRINGE | INTRAVENOUS | Status: DC | PRN

## 2019-05-21 MED ORDER — SODIUM CHLORIDE (PF) 0.9 % IJ SOLN
INTRAMUSCULAR | Status: DC | PRN
  Administered 2019-05-21: 12 mL/h via EPIDURAL

## 2019-05-21 MED ORDER — SODIUM CHLORIDE 0.9 % IV SOLN
5.0000 10*6.[IU] | Freq: Once | INTRAVENOUS | Status: AC
  Administered 2019-05-21: 5 10*6.[IU] via INTRAVENOUS
  Filled 2019-05-21: qty 5

## 2019-05-21 MED ORDER — OXYTOCIN BOLUS FROM INFUSION
500.0000 mL | Freq: Once | INTRAVENOUS | Status: AC
  Administered 2019-05-21: 500 mL via INTRAVENOUS

## 2019-05-21 MED ORDER — ACETAMINOPHEN 325 MG PO TABS: 650.0000 mg | ORAL_TABLET | ORAL | Status: DC | PRN

## 2019-05-21 MED ORDER — ACETAMINOPHEN 325 MG PO TABS
650.0000 mg | ORAL_TABLET | ORAL | Status: DC | PRN
  Administered 2019-05-21 – 2019-05-23 (×6): 650 mg via ORAL
  Filled 2019-05-21 (×6): qty 2

## 2019-05-21 MED ORDER — ONDANSETRON HCL 4 MG/2ML IJ SOLN: 4.0000 mg | INTRAMUSCULAR | Status: DC | PRN

## 2019-05-21 MED ORDER — OXYTOCIN 40 UNITS IN NORMAL SALINE INFUSION - SIMPLE MED
2.5000 [IU]/h | INTRAVENOUS | Status: DC
  Filled 2019-05-21: qty 1000

## 2019-05-21 MED ORDER — LACTATED RINGERS IV SOLN: 500.0000 mL | Freq: Once | INTRAVENOUS | Status: DC

## 2019-05-21 MED ORDER — DIPHENHYDRAMINE HCL 25 MG PO CAPS: 25.0000 mg | ORAL_CAPSULE | Freq: Four times a day (QID) | ORAL | Status: DC | PRN

## 2019-05-21 MED ORDER — ONDANSETRON HCL 4 MG PO TABS: 4.0000 mg | ORAL_TABLET | ORAL | Status: DC | PRN

## 2019-05-21 MED ORDER — OXYCODONE-ACETAMINOPHEN 5-325 MG PO TABS: 1.0000 | ORAL_TABLET | ORAL | Status: DC | PRN

## 2019-05-21 MED ORDER — WITCH HAZEL-GLYCERIN EX PADS: 1.0000 "application " | MEDICATED_PAD | CUTANEOUS | Status: DC | PRN

## 2019-05-21 MED ORDER — COCONUT OIL OIL: 1.0000 "application " | TOPICAL_OIL | Status: DC | PRN

## 2019-05-21 MED ORDER — LACTATED RINGERS IV SOLN: INTRAVENOUS | Status: DC

## 2019-05-21 NOTE — Discharge Summary (Signed)
Postpartum Discharge Summary     Patient Name: Catherine Houston DOB: July 29, 1980 MRN: [id number]  Date of admission: 05/21/2019 Delivering Provider: Debbrah Alar   Date of discharge: 05/23/2019  Admitting diagnosis: Gestational diabetes mellitus (GDM) affecting pregnancy, antepartum [O24.419] Intrauterine pregnancy: [redacted]w[redacted]d    Secondary diagnosis:  Active Problems:   Hereditary disease in family possibly affecting fetus, affecting management of mother, antepartum condition or complication, not applicable or unspecified fetus   History of genital HSV-1 infection   BMI 31.0-31.9,adult   Gestational diabetes mellitus (GDM) affecting pregnancy, antepartum   History of gestational diabetes mellitus (GDM)   Gestational hypertension affecting third pregnancy  Additional problems: None     Discharge diagnosis: Term Pregnancy Delivered, Gestational Hypertension and GDM A2                                                                                                Post partum procedures:none  Augmentation: Pitocin  Complications: None  Hospital course:  Induction of Labor With Vaginal Delivery   39y.o. yo G3P3003 at 337w0das admitted to the hospital 05/21/2019 for induction of labor.  Indication for induction: A2 DM.  Patient had an uncomplicated labor course as follows: Membrane Rupture Time/Date: 11:42 AM ,05/21/2019   Intrapartum Procedures: Episiotomy: None [1]                                         Lacerations:  None [1]  Patient had delivery of a Viable infant.  Information for the patient's newborn:  LiLakeithia, Rasor0[[id number]]Delivery Method: Vaginal, Spontaneous(Filed from Delivery Summary)    05/21/2019  Details of delivery can be found in separate delivery note.  Patient had a routine postpartum course. BP's monitored and WNL. Fasting AM glucose 87. Patient is discharged home 05/23/19. Delivery time: 7:54 PM    Magnesium Sulfate received: No BMZ received:  No Rhophylac:No MMR:No Transfusion:No  Physical exam  Vitals:   05/22/19 0759 05/22/19 1104 05/22/19 1342 05/22/19 2122  BP: 123/76 108/65 120/75 128/71  Pulse: 63 71 73 72  Resp: 18 18 18 17   Temp: 98.3 F (36.8 C) 98.1 F (36.7 C) 97.7 F (36.5 C) 98.2 F (36.8 C)  TempSrc: Axillary Oral Oral Oral  SpO2: 96%  97%   Weight:      Height:       General: alert, cooperative and no distress Lochia: appropriate Uterine Fundus: firm Incision: N/A DVT Evaluation: No evidence of DVT seen on physical exam. Labs: Lab Results  Component Value Date   WBC 16.5 (H) 05/22/2019   HGB 11.3 (L) 05/22/2019   HCT 33.8 (L) 05/22/2019   MCV 86.9 05/22/2019   PLT 205 05/22/2019   CMP Latest Ref Rng & Units 05/21/2019  Glucose 70 - 99 mg/dL 97  BUN 6 - 20 mg/dL 13  Creatinine 0.44 - 1.00 mg/dL 0.56  Sodium 135 - 145 mmol/L 135  Potassium 3.5 - 5.1 mmol/L 3.9  Chloride 98 - 111 mmol/L 106  CO2 22 - 32 mmol/L 19(L)  Calcium 8.9 - 10.3 mg/dL 9.2  Total Protein 6.5 - 8.1 g/dL 6.6  Total Bilirubin 0.3 - 1.2 mg/dL 0.4  Alkaline Phos 38 - 126 U/L 88  AST 15 - 41 U/L 20  ALT 0 - 44 U/L 16   Edinburgh Score: Edinburgh Postnatal Depression Scale Screening Tool 05/22/2019  I have been able to laugh and see the funny side of things. 0  I have looked forward with enjoyment to things. 0  I have blamed myself unnecessarily when things went wrong. 0  I have been anxious or worried for no good reason. 0  I have felt scared or panicky for no good reason. 0  Things have been getting on top of me. 0  I have been so unhappy that I have had difficulty sleeping. 0  I have felt sad or miserable. 0  I have been so unhappy that I have been crying. 0  The thought of harming myself has occurred to me. 0  Edinburgh Postnatal Depression Scale Total 0    Discharge instruction: per After Visit Summary and "Baby and Me Booklet".  After visit meds:  Allergies as of 05/23/2019   No Known Allergies      Medication List    STOP taking these medications   Accu-Chek FastClix Lancets Misc   Accu-Chek Guide test strip Generic drug: glucose blood   aspirin EC 81 MG tablet   famotidine 10 MG chewable tablet Commonly known as: PEPCID AC   glyBURIDE 2.5 MG tablet Commonly known as: DIABETA   Misc. Devices Misc     TAKE these medications   Blood Pressure Cuff Misc 1 Device by Does not apply route once a week. What changed: Another medication with the same name was removed. Continue taking this medication, and follow the directions you see here.   PRENATAL VITAMIN PO Take 1 tablet by mouth at bedtime.       Diet: routine diet  Activity: Advance as tolerated. Pelvic rest for 6 weeks.   Outpatient follow up:4 weeks Follow up Appt: Future Appointments  Date Time Provider [place]  05/30/2019 11:00 AM [place] None  07/03/2019  8:30 AM [place]loan Leiter, MD [facility] None  07/03/2019  9:00 AM [facility] None   Follow up Visit:    Please schedule this patient for Postpartum visit in: 4 weeks with the following provider: Any provider Virtual For C/S patients schedule nurse incision check in weeks 2 weeks: no High risk pregnancy complicated by: GDM and gHTN Delivery mode:  SVD Anticipated Birth Control:  Nuvaring and partner vasectomy PP Procedures needed: BP check, GTT  Schedule Integrated [place] visit: no     Newborn Data: Live born female  Birth Weight: 4400g  APGAR: 59, 9  Newborn Delivery   Birth date/time: 05/21/2019 19:54:00 Delivery type: Vaginal, Spontaneous      Baby Feeding: Bottle and Breast Disposition:home with mother   05/23/2019 Chauncey Mann, MD

## 2019-05-21 NOTE — Anesthesia Preprocedure Evaluation (Signed)
Anesthesia Evaluation  Patient identified by MRN, date of birth, ID band Patient awake    Reviewed: Allergy & Precautions, NPO status , Patient's Chart, lab work & pertinent test results  Airway Mallampati: II  TM Distance: >3 FB Neck ROM: Full    Dental no notable dental hx. (+) Teeth Intact   Pulmonary neg pulmonary ROS,    Pulmonary exam normal breath sounds clear to auscultation       Cardiovascular Exercise Tolerance: Good negative cardio ROS Normal cardiovascular exam Rhythm:Regular Rate:Normal     Neuro/Psych negative neurological ROS     GI/Hepatic negative GI ROS, Neg liver ROS,   Endo/Other  diabetes, Gestational, Oral Hypoglycemic Agents  Renal/GU negative Renal ROS     Musculoskeletal   Abdominal (+) + obese,   Peds negative pediatric ROS (+)  Hematology Hgb 13.5 Plt 234   Anesthesia Other Findings   Reproductive/Obstetrics (+) Pregnancy                            Anesthesia Physical Anesthesia Plan  ASA: III  Anesthesia Plan: Epidural   Post-op Pain Management:    Induction:   PONV Risk Score and Plan:   Airway Management Planned:   Additional Equipment:   Intra-op Plan:   Post-operative Plan:   Informed Consent: I have reviewed the patients History and Physical, chart, labs and discussed the procedure including the risks, benefits and alternatives for the proposed anesthesia with the patient or authorized representative who has indicated his/her understanding and acceptance.       Plan Discussed with:   Anesthesia Plan Comments: (39 Wk G3P2 w GDM for LEA)        Anesthesia Quick Evaluation

## 2019-05-21 NOTE — Progress Notes (Signed)
LABOR PROGRESS NOTE  Catherine Houston is a 39 y.o. G3P2002 at [redacted]w[redacted]d  admitted for IOL for GDMA2, patient presenting to hospital for induction laboring on own   Subjective: Patient comfortable with epidural, coping well   Objective: BP 130/83   Pulse 76   Ht 5\' 2"  (1.575 m)   Wt 90.5 kg   LMP 08/21/2018 (Exact Date)   SpO2 99%   BMI 36.51 kg/m  or  Vitals:   05/21/19 1100 05/21/19 1130 05/21/19 1200 05/21/19 1230  BP: (!) 139/91 (!) 144/89 111/66 130/83  Pulse: 75 72 65 76  SpO2:      Weight:      Height:        Dilation: 7 Effacement (%): 60 Station: -2 Presentation: Vertex Exam by:: Stann Mainland, CNM FHT: baseline rate 135, moderate/minimal varibility, +accel, no decel Toco: 5-6 minutes  Labs: Lab Results  Component Value Date   WBC 9.1 05/21/2019   HGB 13.5 05/21/2019   HCT 40.3 05/21/2019   MCV 86.3 05/21/2019   PLT 234 05/21/2019    Patient Active Problem List   Diagnosis Date Noted  . History of gestational diabetes mellitus (GDM) 11/06/2018  . Supervision of high risk pregnancy, antepartum 11/06/2018  . Gestational diabetes mellitus (GDM) affecting pregnancy, antepartum 09/13/2016  . 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 BMI 31.0-31.9,adult 03/23/2016  . Hereditary disease in family possibly affecting fetus, affecting management of mother, antepartum condition or complication, not applicable or unspecified fetus     Assessment / Plan: 39 y.o. G3P2002 at [redacted]w[redacted]d here for IOL   Labor: Progressing well on own, will start pitocin once GBS antibiotics complete  Fetal Wellbeing:  Cat II Pain Control:  Epidural Anticipated MOD:  SVD Hypertension: elevated BP intrapartum, PEC labs negative and no s/s of PEC. Patient diagnosed with Catherine Houston, CNM 05/21/2019, 12:47 PM

## 2019-05-21 NOTE — Progress Notes (Signed)
LABOR PROGRESS NOTE  Catherine Houston is a 39 y.o. G3P2002 at [redacted]w[redacted]d  admitted for IOL for GDMA2   Subjective: Patient comfortable with epidural   Objective: BP (!) 150/90   Pulse 86   Temp 97.8 F (36.6 C) (Oral)   Ht 5\' 2"  (1.575 m)   Wt 90.5 kg   LMP 08/21/2018 (Exact Date)   SpO2 99%   BMI 36.51 kg/m  or  Vitals:   05/21/19 1600 05/21/19 1630 05/21/19 1700 05/21/19 1730  BP: 119/68 133/69 125/68 (!) 150/90  Pulse: 66 70 71 86  Temp:      TempSrc:      SpO2:      Weight:      Height:        Dilation: Lip/rim Effacement (%): 100 Station: 0 Presentation: Vertex Exam by:: Janeann Paisley FHT: baseline rate 140, moderate/minimal varibility, +accel, no decel Toco: q2-3 minutes   Labs: Lab Results  Component Value Date   WBC 9.1 05/21/2019   HGB 13.5 05/21/2019   HCT 40.3 05/21/2019   MCV 86.3 05/21/2019   PLT 234 05/21/2019    Patient Active Problem List   Diagnosis Date Noted  . History of gestational diabetes mellitus (GDM) 11/06/2018  . Supervision of high risk pregnancy, antepartum 11/06/2018  . Gestational diabetes mellitus (GDM) affecting pregnancy, antepartum 09/13/2016  . 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 BMI 31.0-31.9,adult 03/23/2016  . Hereditary disease in family possibly affecting fetus, affecting management of mother, antepartum condition or complication, not applicable or unspecified fetus     Assessment / Plan: 39 y.o. G3P2002 at [redacted]w[redacted]d here for IOL for A2GDM   Labor: Progressing in active labor, anticipate start of second stage shortly Fetal Wellbeing:  Cat II at time of exam; moderate variability and accelerations present at time of note completion at 1820 for Category 1 tracing. Pain Control:  Epidural  Anticipated MOD:  SVD  A2GDM: reviewed patient's ultrasound on 4/8 with EFW of 4077g and AC >99%ile.  Discussed at length with patient and partner in presence of Darrol Poke CNM and labor RN Anderson Malta Cox increased risk of shoulder  dystocia with diabetes due to disproportionate increase in AC/fetal girth as well as increased risk of shoulder dystocia associated with fetal macrosomia.  Discussed range of error for ultrasound at term.  Discussed that at 4500g EFW with diabetes we do offer cesarean delivery to reduce, although not eliminate, risk of fetal injury secondary to shoulder dystocia, and based on fetal weight on previous ultrasound and passage of time it is likely that she may be around this threshold and would be a candidate for cesarean delivery.  Discussed risks of fetal injury with shoulder dystocia including prolonged extraction, shoulder dystocia maneuvers, risk of fetal fractures or neurologic injury.  Discussed risks of cesarean including surgical risks for mother.  All questions were answered and patient and partner indicate good understanding.  At this time, the patient does desire to continue to attempt vaginal delivery given the various risks and her personal preferences.  She is aware that her delivery could be anywhere on the spectrum of outcomes discussed from an uneventful birth to a severe dystocia requiring heroic measures with serious complications.  Debbrah Alar, MD 05/21/2019, 5:38 PM

## 2019-05-21 NOTE — Progress Notes (Addendum)
LABOR PROGRESS NOTE  Catherine Houston is a 39 y.o. G3P2002 at [redacted]w[redacted]d  admitted for IOL for GDMA2   Subjective: Patient comfortable with epidural   Objective: BP 137/85   Pulse 69   Ht 5\' 2"  (1.575 m)   Wt 90.5 kg   LMP 08/21/2018 (Exact Date)   SpO2 99%   BMI 36.51 kg/m  or  Vitals:   05/21/19 1230 05/21/19 1300 05/21/19 1330 05/21/19 1400  BP: 130/83 136/86 137/83 137/85  Pulse: 76 71 77 69  SpO2:      Weight:      Height:        Dilation: 7.5 Effacement (%): 90 Station: -2 Presentation: Vertex Exam by:: J.Cox, RN FHT: baseline rate 130, moderate/minimal varibility, +accel, no decel Toco: 3 minutes   Labs: Lab Results  Component Value Date   WBC 9.1 05/21/2019   HGB 13.5 05/21/2019   HCT 40.3 05/21/2019   MCV 86.3 05/21/2019   PLT 234 05/21/2019    Patient Active Problem List   Diagnosis Date Noted  . History of gestational diabetes mellitus (GDM) 11/06/2018  . Supervision of high risk pregnancy, antepartum 11/06/2018  . Gestational diabetes mellitus (GDM) affecting pregnancy, antepartum 09/13/2016  . 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 BMI 31.0-31.9,adult 03/23/2016  . Hereditary disease in family possibly affecting fetus, affecting management of mother, antepartum condition or complication, not applicable or unspecified fetus     Assessment / Plan: 39 y.o. G3P2002 at [redacted]w[redacted]d here for IOL for A2GDM   Labor: Minimal cervical change over the past 2 hours after SOL, pitocin initiated Fetal Wellbeing:  Cat II  Pain Control:  Epidural  Anticipated MOD:  SVD  Lajean Manes, CNM 05/21/2019, 2:22 PM

## 2019-05-21 NOTE — Anesthesia Procedure Notes (Signed)
Epidural Patient location during procedure: OB Start time: 05/21/2019 8:50 AM End time: 05/21/2019 9:06 AM  Staffing Anesthesiologist: Barnet Glasgow, MD Performed: anesthesiologist   Preanesthetic Checklist Completed: patient identified, IV checked, site marked, risks and benefits discussed, surgical consent, monitors and equipment checked, pre-op evaluation and timeout performed  Epidural Patient position: sitting Prep: DuraPrep and site prepped and draped Patient monitoring: continuous pulse ox and blood pressure Approach: midline Location: L3-L4 Injection technique: LOR air  Needle:  Needle type: Tuohy  Needle gauge: 17 G Needle length: 9 cm and 9 Needle insertion depth: 7 cm Catheter type: closed end flexible Catheter size: 19 Gauge Catheter at skin depth: 12 cm Test dose: negative  Assessment Events: blood not aspirated, injection not painful, no injection resistance, no paresthesia and negative IV test  Additional Notes Patient identified. Risks/Benefits/Options discussed with patient including but not limited to bleeding, infection, nerve damage, paralysis, failed block, incomplete pain control, headache, blood pressure changes, nausea, vomiting, reactions to medication both or allergic, itching and postpartum back pain. Confirmed with bedside nurse the patient's most recent platelet count. Confirmed with patient that they are not currently taking any anticoagulation, have any bleeding history or any family history of bleeding disorders. Patient expressed understanding and wished to proceed. All questions were answered. Sterile technique was used throughout the entire procedure. Please see nursing notes for vital signs. Test dose was given through epidural needle and negative prior to continuing to dose epidural or start infusion. Warning signs of high block given to the patient including shortness of breath, tingling/numbness in hands, complete motor block, or any  concerning symptoms with instructions to call for help. Patient was given instructions on fall risk and not to get out of bed. All questions and concerns addressed with instructions to call with any issues. 1  Attempt (S) . Patient tolerated procedure well.

## 2019-05-21 NOTE — Progress Notes (Signed)
LABOR PROGRESS NOTE  Catherine Houston is a 39 y.o. G3P2002 at [redacted]w[redacted]d  admitted for IOL for GDMA2   Subjective: Patient comfortable with epidural   Objective: BP (!) 146/86   Pulse 83   Temp 98 F (36.7 C) (Oral)   Ht 5\' 2"  (1.575 m)   Wt 90.5 kg   LMP 08/21/2018 (Exact Date)   SpO2 99%   BMI 36.51 kg/m  or  Vitals:   05/21/19 1800 05/21/19 1826 05/21/19 1830 05/21/19 1900  BP: (!) 145/93  (!) 147/88 (!) 146/86  Pulse: 87  83 83  Temp:  98 F (36.7 C)    TempSrc:  Oral    SpO2:      Weight:      Height:       Complete/100/+1, LOA   FHT: baseline rate 150, moderate varibility, +accel, no decel Toco: q2-3 minutes   Labs: Lab Results  Component Value Date   WBC 9.1 05/21/2019   HGB 13.5 05/21/2019   HCT 40.3 05/21/2019   MCV 86.3 05/21/2019   PLT 234 05/21/2019    Patient Active Problem List   Diagnosis Date Noted  . History of gestational diabetes mellitus (GDM) 11/06/2018  . Supervision of high risk pregnancy, antepartum 11/06/2018  . Gestational diabetes mellitus (GDM) affecting pregnancy, antepartum 09/13/2016  . 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 BMI 31.0-31.9,adult 03/23/2016  . Hereditary disease in family possibly affecting fetus, affecting management of mother, antepartum condition or complication, not applicable or unspecified fetus     Assessment / Plan: 39 y.o. G3P2002 at [redacted]w[redacted]d here for IOL for A2GDM   Labor:  Complete, pushed effectively for test push, will begin setting up for delivery Fetal Wellbeing:  Cat 1 tracing Pain Control:  Epidural  Anticipated MOD:  SVD  GHTN, Shaune Pascal, MD 05/21/2019, 7:41 PM

## 2019-05-21 NOTE — H&P (Addendum)
OBSTETRIC ADMISSION HISTORY AND PHYSICAL  Catherine Houston is a 39 y.o. female G3P2002 with IUP at 13w0dby LMP presenting for IOL for GMahnomenbut also contracting since 0300. She reports +FMs, No LOF, no VB, no blurry vision, headaches or peripheral edema, and RUQ pain.  She plans on breast feeding. She requests Nuvaring for birth control. [facility]  Estimated Date of Delivery: 05/28/19  Sono: 05/03/2019   _0 , CWD, normal anatomy, cephalic presentation, posterior fundal placental lie, 4077g, 99% EFW  Prenatal History/Complications: LGA > [id number]/Pre-diabetes GBS pos Maternal Obesity  Past Medical History: Past Medical History:  Diagnosis Date  . Genital HSV    HSV1  . Gestational diabetes   . UTI (urinary tract infection)    2015    Past Surgical History: Past Surgical History:  Procedure Laterality Date  . NO PAST SURGERIES      Obstetrical History: OB History    Gravida  3   Para  2   Term  2   Preterm      AB      Living  2     SAB      TAB      Ectopic      Multiple  0   Live Births  2           Social History: Social History   Socioeconomic History  . Marital status: Married    Spouse name: Not on file  . Number of children: Not on file  . Years of education: Not on file  . Highest education level: Not on file  Occupational History  . Not on file  Tobacco Use  . Smoking status: Never Smoker  . Smokeless tobacco: Never Used  Substance and Sexual Activity  . Alcohol use: No  . Drug use: No  . Sexual activity: Yes    Birth control/protection: None  Other Topics Concern  . Not on file  Social History Narrative   Marital status: married x 2017      Children: 2 children ((34yo, newborn)      Lives: with husband, 2 children      Employment: homemaker      Tobacco: none       Alcohol: none       Exercise: walking 30 minutes daily.   Social Determinants of Health   Financial  Resource Strain:   . Difficulty of Paying Living Expenses:   Food Insecurity:   . Worried About RCharity fundraiserin the Last Year:   . RArboriculturistin the Last Year:   Transportation Needs:   . LFilm/video editor(Medical):   .Marland KitchenLack of Transportation (Non-Medical):   Physical Activity:   . Days of Exercise per Week:   . Minutes of Exercise per Session:   Stress:   . Feeling of Stress :   Social Connections:   . Frequency of Communication with Friends and Family:   . Frequency of Social Gatherings with Friends and Family:   . Attends Religious Services:   . Active Member of Clubs or Organizations:   . Attends CArchivistMeetings:   .Marland KitchenMarital Status:     Family History: Family History  Problem Relation Age of Onset  . Diabetes Mother   . Hypertension Mother   . Diabetes Father     Allergies: No Known Allergies  Medications Prior to Admission  Medication Sig Dispense Refill Last Dose  . Accu-Chek FastClix Lancets MISC 1 Device by Percutaneous route 4 (four) times daily. 100 each 12   . aspirin EC 81 MG tablet Take 1 tablet (81 mg total) by mouth daily. Take after 12 weeks for prevention of preeclampsia later in pregnancy 300 tablet 2   . Blood Pressure Monitoring (BLOOD PRESSURE CUFF) MISC 1 Device by Does not apply route once a week. 1 each 0   . Blood Pressure Monitoring (BLOOD PRESSURE KIT) DEVI 1 kit by Does not apply route as needed. 1 each 0   . famotidine (PEPCID AC) 10 MG chewable tablet Chew 10 mg by mouth daily as needed for heartburn.     Marland Kitchen glucose blood (ACCU-CHEK GUIDE) test strip 1 each by Other route 4 (four) times daily. Use 4 times a day. 120 each 12   . glyBURIDE (DIABETA) 2.5 MG tablet Take 1 tablet (2.5 mg total) by mouth 2 (two) times daily at 8 am and 10 pm. (Patient not taking: Reported on 05/17/2019) 90 tablet 2   . glyBURIDE (DIABETA) 2.5 MG tablet Take 2 tablets (5 mg) with breakfast and 1 tablet (2.5 mg) with dinner 90 tablet 3   .  Misc. Devices MISC Dispense one maternity belt for patient 1 each 0   . Prenatal Vit-Fe Fumarate-FA (PRENATAL VITAMIN PO) Take 1 tablet by mouth at bedtime.         Review of Systems   All systems reviewed and negative except as stated in HPI  Height 5' 2" (1.575 m), weight 90.5 kg, last menstrual period 08/21/2018, currently breastfeeding. General appearance: alert, cooperative and appears stated age Lungs: normal effort Heart: regular rate  Abdomen: soft, non-tender; bowel sounds normal Pelvic: gravid uterus Extremities: Homans sign is negative, no sign of DVT Presentation: cephalic Fetal monitoringBaseline: 145 bpm, Variability: Good {> 6 bpm), Accelerations: Reactive and Decelerations: Absent Uterine activity: Frequency: Every 4 minutes Dilation: 4 Effacement (%): 60 Station: -2 Exam by:: J.Cox, RN   Prenatal labs: ABO, Rh: AB/Positive/-- (10/20 0737) Antibody: Negative (10/20 0837) Rubella: 1.68 (10/20 0837) RPR: Non Reactive (02/16 0814)  HBsAg: Negative (10/20 0837)  HIV: Non Reactive (02/16 0814)  GBS: Positive/-- (04/14 1049)  2 hr Glucola abnormal Genetic screening  Low risk Anatomy US normal  Prenatal Transfer Tool  Maternal Diabetes: Yes:  Diabetes Type:  Pre-pregnancy, Insulin/Medication controlled Genetic Screening: Normal Maternal Ultrasounds/Referrals: Normal Fetal Ultrasounds or other Referrals:  Fetal Echo WNL Maternal Substance Abuse:  No Significant Maternal Medications:  None Significant Maternal Lab Results: Group B Strep positive  Results for orders placed or performed during the hospital encounter of 05/21/19 (from the past 24 hour(s))  CBC   Collection Time: 05/21/19  7:11 AM  Result Value Ref Range   WBC 9.1 4.0 - 10.5 K/uL   RBC 4.67 3.87 - 5.11 MIL/uL   Hemoglobin 13.5 12.0 - 15.0 g/dL   HCT 40.3 36.0 - 46.0 %   MCV 86.3 80.0 - 100.0 fL   MCH 28.9 26.0 - 34.0 pg   MCHC 33.5 30.0 - 36.0 g/dL   RDW 14.0 11.5 - 15.5 %   Platelets 234  150 - 400 K/uL   nRBC 0.0 0.0 - 0.2 %    Patient Active Problem List   Diagnosis Date Noted  . History of gestational diabetes mellitus (GDM) 11/06/2018  . Supervision of high risk pregnancy, antepartum 11/06/2018  . Gestational diabetes mellitus (GDM) affecting pregnancy, antepartum 09/13/2016  . History  of genital HSV-1 infection 03/23/2016  . BMI 31.0-31.9,adult 03/23/2016  . Hereditary disease in family possibly affecting fetus, affecting management of mother, antepartum condition or complication, not applicable or unspecified fetus     Assessment/Plan:  Lorain Fettes is a 39 y.o. G3P2002 at 89w0dhere for IOL for GStrat[place]  #Labor: Vertex by RN exam. Expectant management. AROM/Pit PRN. Anticipate SVD. #Pain: Per patient request #FWB: Cat I; EFW: 4000g #ID:  GBS pos; PCN #MOF: Breast #MOC: Nuvaring/Vasectomy #Circ:  Declines #GDMA2: A1c 6.1, pre-diabetes as well and patient reports she has PCP following already. CBG's q4h latent, q2h active labor. Fetal Echo WNL.  CBarrington Ellison MD [facility] Medicine Fellow, [facility] 05/21/2019, 8:16 AM

## 2019-05-22 LAB — CBC
HCT: 33.8 % — ABNORMAL LOW (ref 36.0–46.0)
Hemoglobin: 11.3 g/dL — ABNORMAL LOW (ref 12.0–15.0)
MCH: 29 pg (ref 26.0–34.0)
MCHC: 33.4 g/dL (ref 30.0–36.0)
MCV: 86.9 fL (ref 80.0–100.0)
Platelets: 205 10*3/uL (ref 150–400)
RBC: 3.89 MIL/uL (ref 3.87–5.11)
RDW: 14.2 % (ref 11.5–15.5)
WBC: 16.5 10*3/uL — ABNORMAL HIGH (ref 4.0–10.5)
nRBC: 0 % (ref 0.0–0.2)

## 2019-05-22 LAB — GLUCOSE, CAPILLARY: Glucose-Capillary: 87 mg/dL (ref 70–99)

## 2019-05-22 NOTE — Plan of Care (Signed)
Progressing

## 2019-05-22 NOTE — Lactation Note (Signed)
This note was copied from a baby's chart. Lactation Consultation Note  Patient Name: Catherine Houston [id number] Date: 05/22/2019 Reason for consult: Follow-up assessment  P3 mother whose infant is now 14 hours old.  This is a term baby at 39+0 weeks.  Mother breast fed her first child for five months and her second child for one year.  Baby was quiet and awake in mother's lap when I arrived.  She had just finished feeding him.  Mother has no questions/concerns related to breast feeding.  She is familiar with hand expression and feeding cues.  Mother will continue to feed 8-12 times/24 hours or sooner if baby shows feeding cues.  Discussed cluster feeding and mother believes that he has already started to cluster feed.  She denies pain with latching.  Mother is a [facility] participant but does not have a DEBP for home use.  She will be contacting the [facility] office to determine eligibility to receive a pump.  Manual pump at bedside.  No support person present at this time.   Maternal Data    Feeding Feeding Type: Breast Fed  LATCH Score                   Interventions    Lactation Tools Discussed/Used     Consult Status Consult Status: Follow-up Date: 05/23/19 Follow-up type: In-patient    [name] 05/22/2019, 6:28 PM

## 2019-05-22 NOTE — Progress Notes (Addendum)
Post Partum Day 1 Subjective: Patient doing well.  Reports voiding and having flatus.  Pt ambulatory in the room and is eating well. Pain is well controlled.   Objective: Blood pressure 123/76, pulse 63, temperature 98.3 F (36.8 C), temperature source Axillary, resp. rate 18, height 5\' 2"  (1.575 m), weight 90.5 kg, last menstrual period 08/21/2018, SpO2 96 %, unknown if currently breastfeeding.  Physical Exam:  General: alert and no distress Lochia: appropriate Uterine Fundus: firm Incision: NA DVT Evaluation: No cords or calf tenderness. No significant calf/ankle edema.  Recent Labs    05/21/19 0711 05/22/19 0235  HGB 13.5 11.3*  HCT 40.3 33.8*    Assessment/Plan: Plan for discharge tomorrow, Breast and bottle feeding and Contraception : vasectomy and Nuvaring   Hx of GDMA2 this pregnancy: AM CBG 87.  gHTN this pregnancy: Normotensive. Will continue to monitor.   LOS: 1 day   Lyndee Hensen , DO  05/22/2019, 8:08 AM   I saw and evaluated the patient. I agree with the findings and the plan of care as documented in the resident's note. Okay to discharge today if baby can; RN to page team for orders.  Barrington Ellison, MD [facility] Family Medicine Fellow, [facility] for [employer], [place]

## 2019-05-22 NOTE — Anesthesia Postprocedure Evaluation (Signed)
Anesthesia Post Note  Patient: Catherine Houston  Procedure(s) Performed: AN AD [place]     Patient location during evaluation: Mother Baby Anesthesia Type: Epidural Level of consciousness: awake and alert Pain management: pain level controlled Vital Signs Assessment: post-procedure vital signs reviewed and stable Respiratory status: spontaneous breathing, nonlabored ventilation and respiratory function stable Cardiovascular status: stable Postop Assessment: no headache, no backache and epidural receding Anesthetic complications: no Comments: Per RN    Last Vitals:  Vitals:   05/22/19 1104 05/22/19 1342  BP: 108/65 120/75  Pulse: 71 73  Resp: 18 18  Temp: 36.7 C 36.5 C  SpO2:  97%    Last Pain:  Vitals:   05/22/19 1500  TempSrc:   PainSc: 3    Pain Goal:                   Brayley Mackowiak

## 2019-05-22 NOTE — Lactation Note (Signed)
This note was copied from a baby's chart. Lactation Consultation Note  Patient Name: Catherine Houston [id number] Date: 05/22/2019  P3, 4 hour female LGA infant. LC did not enter room, there was a notice outside mom's room  door "do not disturb." .    Maternal Data    Feeding Feeding Type: Bottle Fed - Formula Nipple Type: Slow - flow  LATCH Score Latch: Repeated attempts needed to sustain latch, nipple held in mouth throughout feeding, stimulation needed to elicit sucking reflex.  Audible Swallowing: None  Type of Nipple: Everted at rest and after stimulation  Comfort (Breast/Nipple): Soft / non-tender  Hold (Positioning): Assistance needed to correctly position infant at breast and maintain latch.  LATCH Score: 6  Interventions Interventions: Breast feeding basics reviewed;Assisted with latch;Skin to skin;Breast massage;Hand express  Lactation Tools Discussed/Used     Consult Status      Vicente Serene 05/22/2019, 12:21 AM

## 2019-05-22 NOTE — Lactation Note (Signed)
This note was copied from a baby's chart. Lactation Consultation Note  Patient Name: Catherine Houston [id number] Date: 05/22/2019 Reason for consult: Initial assessment;Term;Other (Comment)(LGA infant greater than 9 lbs.) P3, 8 hour LGA term female infant. Per mom, she feels breastfeeding is going well no concerns at this time. LC did not observe latch at this time, per dad, infant breastfed for 15 minutes, less than one hour prior to [facility] entering the room. Per mom, infant breastfed for 5 minutes in L&D, 2nd time -20 minutes and 3rd for 15 minutes, mom feels infant latches well at breast. Infant has been bottle fed 2X receiving 5 mls for each feeding. Mom with hx: GDM Mom is experienced at breastfeeding she breastfed her 1st child for 5 months and 2nd child who is 2 years one one year. Mom is active on [facility] in [facility] and she doesn't have breast pump at home.  Mom knows to breastfeed infant according to hunger cues, on demand and not to exceed 3 hours without breastfeeding infant. Mom knows to call RN or LC if she has any questions, concerns or needs assistance with latching infant at breast.  Parents will continue to do as much STS as possible. Reviewed Baby & Me book's Breastfeeding Basics.  Mom will breastfeed infant for each feeding and if she chooses then supplement infant with formula, mom understands feeding on cues with help establish her milk supply and increase breastfeeding volume. Mom was given supplemental guideline sheets supplementation after breastfeeding and understands if she choose to give infant formula after latching infant at breast to supplement with 5-7 mls first 24 hours of life. Mom made aware of O/P services, breastfeeding support groups, community resources, and our phone # for post-discharge questions. .   Maternal Data  Formula Feeding for Exclusion: Yes Reason for exclusion: Mother's choice to formula and breast feed on admission Has patient been  taught Hand Expression?: Yes Does the patient have breastfeeding experience prior to this delivery?: Yes  Feeding Feeding Type: Bottle Fed - Formula Nipple Type: Slow - flow  LATCH Score                   Interventions Interventions: Breast feeding basics reviewed;Breast compression;Hand express;Hand pump;Skin to skin  Lactation Tools Discussed/Used Tools: Pump Breast pump type: Manual WIC Program: Yes Pump Review: Setup, frequency, and cleaning;Milk Storage Initiated by:: Vicente Serene, IBCLC Date initiated:: 05/22/19   Consult Status Consult Status: Follow-up Date: 05/22/19 Follow-up type: In-patient    Vicente Serene 05/22/2019, 4:44 AM

## 2019-05-23 NOTE — Lactation Note (Signed)
This note was copied from a baby's chart. Lactation Consultation Note  Patient Name: Catherine Houston [id number] Date: 05/23/2019 Reason for consult: Follow-up assessment   P3, Ex BF.  Baby recently bf for 25 min and is sleeping. Mother denies questions or concerns and states bf is going well. Feed on demand with cues.  Goal 8-12+ times per day after first 24 hrs.  Place baby STS if not cueing.  Reviewed engorgement care and monitoring voids/stools.    Maternal Data    Feeding Feeding Type: Breast Fed  LATCH Score                   Interventions Interventions: Breast feeding basics reviewed  Lactation Tools Discussed/Used     Consult Status Consult Status: Complete Date: 05/23/19    Vivianne Master [facility] 05/23/2019, 12:16 PM

## 2019-05-29 ENCOUNTER — Telehealth: Payer: Self-pay

## 2019-05-29 NOTE — Telephone Encounter (Signed)
Return call to pt regarding triage vm Pt had concerns about B/P Noted HA yesterday took tylenol and motrin Denies any HA today, no swelling, no visual changes AM B/P was 138/98. I had pt take B/P while on the phone  B/P: 135/96 Consulted with MD in office pt advised to keep B/P check appt tomorrow however to monitor sx's If HA returns and no relief with tylenol , visual changes, swelling or not feeling well  pt made aware to go to [facility] today

## 2019-05-30 ENCOUNTER — Telehealth (INDEPENDENT_AMBULATORY_CARE_PROVIDER_SITE_OTHER): Payer: Medicaid Other

## 2019-05-30 VITALS — BP 139/85 | HR 70

## 2019-05-30 DIAGNOSIS — Z0130 Encounter for examination of blood pressure without abnormal findings: Secondary | ICD-10-CM | POA: Diagnosis not present

## 2019-05-30 NOTE — Progress Notes (Signed)
I connected with  Catherine Houston on 05/30/19 by a video enabled telemedicine application and verified that I am speaking with the correct person using two identifiers.   I discussed the limitations of evaluation and management by telemedicine. The patient expressed understanding and agreed to proceed.  Subjective:  Catherine Houston is a 39 y.o. female virtual visit for BP check.   Hypertension ROS: taking medications as instructed, no medication side effects noted, no TIA's, no chest pain on exertion, no dyspnea on exertion and no swelling of ankles.    Objective:  BP 139/85   Pulse 70   LMP 08/21/2018 (Exact Date)   Breastfeeding Yes   Appearance oriented to person, place, and time. General exam BP noted to be well controlled today in office.    Assessment:   Blood Pressure well controlled.   Plan:  Current treatment plan is effective, no change in therapy. If sx gets worse call off for an appt.  Keep PP appt. Per Dr. Jodi Mourning

## 2019-05-30 NOTE — Progress Notes (Signed)
Patient seen and assessed by nursing staff during this encounter. I have reviewed the chart and agree with the documentation and plan. I have also made any necessary editorial changes.  Mora Bellman, MD 05/30/2019 8:04 PM

## 2019-07-03 ENCOUNTER — Other Ambulatory Visit: Payer: Self-pay

## 2019-07-03 ENCOUNTER — Ambulatory Visit (INDEPENDENT_AMBULATORY_CARE_PROVIDER_SITE_OTHER): Payer: Medicaid Other | Admitting: Student

## 2019-07-03 ENCOUNTER — Other Ambulatory Visit: Payer: Medicaid Other

## 2019-07-03 VITALS — BP 116/78 | HR 83 | Wt 180.0 lb

## 2019-07-03 DIAGNOSIS — Z8632 Personal history of gestational diabetes: Secondary | ICD-10-CM

## 2019-07-03 DIAGNOSIS — O24435 Gestational diabetes mellitus in puerperium, controlled by oral hypoglycemic drugs: Secondary | ICD-10-CM

## 2019-07-03 MED ORDER — ETONOGESTREL-ETHINYL ESTRADIOL 0.12-0.015 MG/24HR VA RING
VAGINAL_RING | VAGINAL | 12 refills | Status: DC
Start: 2019-07-03 — End: 2021-04-22

## 2019-07-03 NOTE — Progress Notes (Signed)
[place] Partum Visit Note  Catherine Houston is a 39 y.o. [id number] female who presents for a postpartum visit. She is 6 weeks postpartum following a normal spontaneous vaginal delivery.  I have fully reviewed the prenatal and intrapartum course. The delivery was at 39 gestational weeks.  Anesthesia: epidural. Postpartum course has been Unremarkable. Baby is doing well. Baby is feeding by breast. Bleeding no bleeding. Bowel function is normal. Bladder function is normal. Patient is not sexually active. Contraception method is none. Postpartum depression screening: negative.EPDS = 0   Patient had GHTN  But had normal BPs on discharge and at BP check. She was never prescribed any BP medicine. She also saw her PCP on 5/22 and a HgbA1c was done; it was 6.3. Her PCP has put her in metformin and she has a follow up scheduled in July.  The following portions of the patient's history were reviewed and updated as appropriate: allergies, current medications, past family history, past medical history, past social history, past surgical history and problem list.  Review of Systems Pertinent items are noted in HPI.    Objective:  Blood pressure 116/78, pulse 83, weight 180 lb (81.6 kg), last menstrual period 08/21/2018, currently breastfeeding.  General:  alert, cooperative and no distress   Breasts:  inspection negative, no nipple discharge or bleeding, no masses or nodularity palpable  Lungs: clear to auscultation bilaterally  Heart:  regular rate and rhythm, S1, S2 normal, no murmur, click, rub or gallop  Abdomen: soft, non-tender; bowel sounds normal; no masses,  no organomegaly   Vulva:  not evaluated  Vagina: not evaluated  Cervix:  not evaluated  Corpus: not examined  Adnexa:  not evaluated  Rectal Exam: Not performed.        Assessment:    [facility] postpartum exam. Pap smear not done at today's visit.   Plan:   Essential components of care per ACOG recommendations:  1.  Mood and well being:  Patient with negative depression screening today. Reviewed local resources for support.  - Patient does not use tobacco. - hx of drug use? No  2. Infant care and feeding:  -Patient currently breastmilk feeding? Yes f breastmilk feeding discussed return to work and pumping. If needed, patient was provided letter for work to allow for every 2-3 hr pumping breaks, and to be granted a private location to express breastmilk and refrigerated area to store breastmilk. Reviewed importance of draining breast regularly to support lactation. -Social determinants of health (SDOH) reviewed in EPIC. No concerns  3. Sexuality, contraception and birth spacing - Patient does not want a pregnancy in the next year.  Desired family size is 3 children.  - Reviewed forms of contraception in tiered fashion. Patient desired NuvaRing vaginal inserts today.  -Husband plans vasectomy  - Discussed birth spacing of 18 months  4. Sleep and fatigue -Encouraged family/partner/community support of 4 hrs of uninterrupted sleep to help with mood and fatigue  5. Physical Recovery  - Discussed patients delivery and complications -  Patient has urinary incontinence? No - Patient is safe to resume physical and sexual activity  6.  Health Maintenance - Last pap smear done 11/06/2018 and was normal with negative HPV.   7. Chronic Disease - Patient will continue on her metformin; she has a HgbA1c of 6.3 so meets criteria for pre-DM. 2 Hour PP GTT not done today as patient is under PCP care for management and recently had DM work up two weeks ago.  -BPs are  normal, not on medicine, and patient wants Nuvaring. Safe to prescribe. She will pick up; patient knows it may decrease milk supply and she is willing to punp extra.    [place]arr Lake, [place] for [employer], [place]

## 2019-07-03 NOTE — Patient Instructions (Signed)
Ethinyl Estradiol; Etonogestrel vaginal ring What is this medicine? ETHINYL ESTRADIOL; ETONOGESTREL (ETH in il es tra DYE ole; et oh noe JES trel) vaginal ring is a flexible, vaginal ring used as a contraceptive (birth control method). This medicine combines 2 types of female hormones, an estrogen and a progestin. This ring is used to prevent ovulation and pregnancy. Each ring is effective for 1 month. This medicine may be used for other purposes; ask your health care provider or pharmacist if you have questions. COMMON BRAND NAME(S): EluRyng, NuvaRing What should I tell my health care provider before I take this medicine? They need to know if you have any of these conditions:  abnormal vaginal bleeding  blood vessel disease or blood clots  breast, cervical, endometrial, ovarian, liver, or uterine cancer  diabetes  gallbladder disease  having surgery  heart disease or recent heart attack  high blood pressure  high cholesterol or triglycerides  history of irregular heartbeat or heart valve problems  kidney disease  liver disease  migraine headaches  protein C deficiency  protein S deficiency  recently had a baby, miscarriage, or abortion  stroke  systemic lupus erythematosus (SLE)  tobacco smoker  your age is more than 38 years old  an unusual or allergic reaction to estrogens, progestins, other medicines, foods, dyes, or preservatives  pregnant or trying to get pregnant  breast-feeding How should I use this medicine? Insert the ring into your vagina as directed. Follow the directions on the prescription label. The ring will remain place for 3 weeks and is then removed for a 1-week break. A new ring is inserted 1 week after the last ring was removed, on the same day of the week. Check often to make sure the ring is still in place. If the ring was out of the vagina for an unknown amount of time, you may not be protected from pregnancy. Perform a pregnancy test and  call your doctor. Do not use more often than directed. A patient package insert for the product will be given with each prescription and refill. Read this sheet carefully each time. The sheet may change frequently. Contact your pediatrician regarding the use of this medicine in children. Special care may be needed. Overdosage: If you think you have taken too much of this medicine contact a poison control center or emergency room at once. NOTE: This medicine is only for you. Do not share this medicine with others. What if I miss a dose? You will need to use the ring exactly as directed. It is very important to follow the schedule every cycle. If you do not use the ring as directed, you may not be protected from pregnancy. If the ring should slip out, is lost, or if you leave it in longer or shorter than you should, contact your health care professional for advice. What may interact with this medicine? Do not take this medicine with the following medications:  dasabuvir; ombitasvir; paritaprevir; ritonavir  ombitasvir; paritaprevir; ritonavir  vaginal lubricants or other vaginal products that are oil-based or silicone-based This medicine may also interact with the following medications:  acetaminophen  antibiotics or medicines for infections, especially rifampin, rifabutin, rifapentine, and griseofulvin, and possibly penicillins or tetracyclines  aprepitant or fosaprepitant  armodafinil  ascorbic acid (vitamin C)  barbiturate medicines, such as phenobarbital or primidone  bosentan  certain antiviral medicines for hepatitis, HIV or AIDS  certain medicines for cancer treatment  certain medicines for seizures like carbamazepine, clobazam, felbamate, lamotrigine, oxcarbazepine, phenytoin,   rufinamide, topiramate  certain medicines for treating high cholesterol  cyclosporine  dantrolene  elagolix  flibanserin  grapefruit juice  lesinurad  medicines for diabetes  medicines  to treat fungal infections, such as griseofulvin, miconazole, fluconazole, ketoconazole, itraconazole, posaconazole or voriconazole  mifepristone  mitotane  modafinil  morphine  mycophenolate  St. John's wort  tamoxifen  temazepam  theophylline or aminophylline  thyroid hormones  tizanidine  tranexamic acid  ulipristal  warfarin This list may not describe all possible interactions. Give your health care provider a list of all the medicines, herbs, non-prescription drugs, or dietary supplements you use. Also tell them if you smoke, drink alcohol, or use illegal drugs. Some items may interact with your medicine. What should I watch for while using this medicine? Visit your doctor or health care professional for regular checks on your progress. You will need a regular breast and pelvic exam and Pap smear while on this medicine. Check with your doctor or health care professional to see if you need an additional method of contraception during the first cycle that you use this ring. Female condoms (made with natural rubber latex, polyisoprene, and polyurethane) and spermicides may be used. Do not use a diaphragm, cervical cap, or a female condom, as the ring can interfere with these birth control methods and their proper placement. If you have any reason to think you are pregnant, stop using this medicine right away and contact your doctor or health care professional. If you are using this medicine for hormone related problems, it may take several cycles of use to see improvement in your condition. Smoking increases the risk of getting a blood clot or having a stroke while you are using hormonal birth control, especially if you are more than 38 years old. You are strongly advised not to smoke. Some women are prone to getting dark patches on the skin of the face (cholasma). Your risk of getting chloasma with this medicine is higher if you had chloasma during a pregnancy. Keep out of the  sun. If you cannot avoid being in the sun, wear protective clothing and use sunscreen. Do not use sun lamps or tanning beds/booths. This medicine can make your body retain fluid, making your fingers, hands, or ankles swell. Your blood pressure can go up. Contact your doctor or health care professional if you feel you are retaining fluid. If you are going to have elective surgery, you may need to stop using this medicine before the surgery. Consult your health care professional for advice. This medicine does not protect you against HIV infection (AIDS) or any other sexually transmitted diseases. What side effects may I notice from receiving this medicine? Side effects that you should report to your doctor or health care professional as soon as possible:  allergic reactions such as skin rash or itching, hives, swelling of the lips, mouth, tongue, or throat  depression  high blood pressure  migraines or severe, sudden headaches  signs and symptoms of a blood clot such as breathing problems; changes in vision; chest pain; severe, sudden headache; pain, swelling, warmth in the leg; trouble speaking; sudden numbness or weakness of the face, arm or leg  signs and symptoms of infection like fever or chills with dizziness and a sunburn-like rash, or pain or trouble passing urine  stomach pain  symptoms of vaginal infection like itching, irritation or unusual discharge  yellowing of the eyes or skin Side effects that usually do not require medical attention (report these to your doctor   or health care professional if they continue or are bothersome):  acne  breast pain, tenderness  irregular vaginal bleeding or spotting, particularly during the first month of use  mild headache  nausea  painful periods  vomiting This list may not describe all possible side effects. Call your doctor for medical advice about side effects. You may report side effects to FDA at 1-800-FDA-1088. Where should I  keep my medicine? Keep out of the reach of children. Store unopened medicine for up to 4 months at room temperature at 15 and 30 degrees C (59 and 86 degrees F). Protect from light. Do not store above 30 degrees C (86 degrees F). Throw away any unused medicine 4 months after the dispense date or the expiration date, whichever comes first. A ring may only be used for 1 cycle (1 month). After the 3-week cycle, a used ring is removed and should be placed in the re-closable foil pouch and discarded in the trash out of reach of children and pets. Do NOT flush down the toilet. NOTE: This sheet is a summary. It may not cover all possible information. If you have questions about this medicine, talk to your doctor, pharmacist, or health care provider.  2020 Elsevier/Gold Standard (2018-08-03 12:31:47)  

## 2019-08-04 ENCOUNTER — Ambulatory Visit: Payer: Medicaid Other | Attending: Internal Medicine

## 2019-08-04 DIAGNOSIS — Z23 Encounter for immunization: Secondary | ICD-10-CM

## 2019-08-04 NOTE — Progress Notes (Signed)
   Covid-19 Vaccination Clinic  Name:  Adeliz Tonkinson    MRN: [id number] DOB: 02/18/1980  08/04/2019  Ms. Burnstein was observed post Covid-19 immunization for 15 minutes without incident. She was provided with Vaccine Information Sheet and instruction to access the V-Safe system.   Ms. Kubicek was instructed to call 911 with any severe reactions post vaccine: Marland Kitchen Difficulty breathing  . Swelling of face and throat  . A fast heartbeat  . A bad rash all over body  . Dizziness and weakness   Immunizations Administered    Name Date Dose VIS Date Route   Pfizer COVID-19 Vaccine 08/04/2019  8:33 AM 0.3 mL 03/21/2018 Intramuscular   Manufacturer: [employer], [employer]   Lot: [id number]   [place]: [id number]

## 2020-05-18 NOTE — Patient Instructions (Signed)
It was wonderful to meet you today. Thank you for allowing me to be a part of your care. Below is a short summary of what we discussed at your visit today:  Establishing as a patient at the [facility] Today we reviewed all of your health history, including past medical conditions, medications, past surgeries, and allergies.  We also reviewed your vaccine status and necessary screenings, those are discussed below.  Vaccines Covid vaccine: You are eligible for the COVID booster vaccine, which we offer here at this clinic. You declined it today.  If you wish to get this in the future, simply call our clinic and request a vaccine only visit with a nurse.   Screenings PAP Smear: You are up-to-date on your Pap smear test. Your last one was 11/06/2018 and was negative. You will not need another one until October of 2023.  We will schedule an appointment for this much closer to the due date.  Mammograms: Unless you have a specific family history of breast cancer, we generally do not start regular breast cancer screenings until the age of 20 years.   Colonoscopy: As you are 40 y.o. and without family history of colon cancer, you will need to start colon cancer screenings with colonoscopies at 39 years of age.     Please bring all of your medications to every appointment!  If you have any questions or concerns, please do not hesitate to contact us via phone or MyChart message.   Ezequiel Essex, MD

## 2020-05-18 NOTE — Progress Notes (Signed)
    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  OBJECTIVE:   BP 118/72   Pulse 70   Wt 178 lb 3.2 oz (80.8 kg)   SpO2 96%   BMI 32.59 kg/m    PHQ-9:  Depression screen [facility] 2/9 05/23/2020 11/06/2016 10/26/2016  Decreased Interest 0 0 0  Down, Depressed, Hopeless 0 0 0  PHQ - 2 Score 0 0 0  Altered sleeping 0 - 0  Tired, decreased energy 0 - 0  Change in appetite 0 - 0  Feeling bad or failure about yourself  0 - 0  Trouble concentrating 0 - 0  Moving slowly or fidgety/restless 0 - 0  Suicidal thoughts 0 - 0  PHQ-9 Score 0 - 0  Some recent data might be hidden     GAD-7:  GAD 7 : Generalized Anxiety Score 10/26/2016 09/10/2016 08/27/2016 08/20/2016  Nervous, Anxious, on Edge 0 0 0 0  Control/stop worrying 0 0 0 0  Worry too much - different things 0 0 0 0  Trouble relaxing 0 0 0 0  Restless 0 0 0 0  Easily annoyed or irritable 0 0 0 0  Afraid - awful might happen 0 0 0 0  Total GAD 7 Score 0 0 0 0    Physical Exam General: Awake, alert, oriented HEENT: PERRL, oral mucosa pink, moist, without lesion, intact dentition without obvious cavity Lymph: No palpable lymphedema of head or neck Cardiovascular: Regular rate and rhythm, S1 and S2 present, no murmurs auscultated Respiratory: Lung fields clear to auscultation bilaterally Abdomen: Soft, nondistended, no TTP in any quadrant, no rebound tenderness or guarding Extremities: No bilateral lower extremity edema, palpable pedal and pretibial pulses bilaterally Neuro: Cranial nerves II through X grossly intact, able to move all extremities  spontaneously   ASSESSMENT/PLAN:   Prediabetes A1c today 6.4.  Currently on metformin 500 mg twice daily.  Urine microalbumin normal. - Continue metformin regimen - Next A1c check 6 months to 1 year  Healthcare maintenance Patient not fasting at time of appointment.  Will ask to return to complete blood work at her convenience. - CMP, lipid panel, CBC, TSH, hep C screening     Ezequiel Essex, MD [place]

## 2020-05-23 ENCOUNTER — Other Ambulatory Visit: Payer: Self-pay

## 2020-05-23 ENCOUNTER — Ambulatory Visit: Payer: Medicaid Other | Admitting: Family Medicine

## 2020-05-23 ENCOUNTER — Encounter: Payer: Self-pay | Admitting: Family Medicine

## 2020-05-23 VITALS — BP 118/72 | HR 70 | Wt 178.2 lb

## 2020-05-23 DIAGNOSIS — Z1159 Encounter for screening for other viral diseases: Secondary | ICD-10-CM

## 2020-05-23 DIAGNOSIS — Z6831 Body mass index (BMI) 31.0-31.9, adult: Secondary | ICD-10-CM

## 2020-05-23 DIAGNOSIS — O24419 Gestational diabetes mellitus in pregnancy, unspecified control: Secondary | ICD-10-CM | POA: Diagnosis present

## 2020-05-23 DIAGNOSIS — R7303 Prediabetes: Secondary | ICD-10-CM | POA: Diagnosis not present

## 2020-05-23 DIAGNOSIS — Z0000 Encounter for general adult medical examination without abnormal findings: Secondary | ICD-10-CM | POA: Insufficient documentation

## 2020-05-23 DIAGNOSIS — Z8632 Personal history of gestational diabetes: Secondary | ICD-10-CM

## 2020-05-23 LAB — POCT GLYCOSYLATED HEMOGLOBIN (HGB A1C): HbA1c, POC (controlled diabetic range): 6.4 % (ref 0.0–7.0)

## 2020-05-23 LAB — POCT UA - MICROALBUMIN
Albumin/Creatinine Ratio, Urine, POC: <30
Creatinine, POC: 200 mg/dL
Microalbumin Ur, POC: 10 mg/L

## 2020-05-23 MED ORDER — METFORMIN HCL 500 MG PO TABS: 500.0000 mg | ORAL_TABLET | Freq: Two times a day (BID) | ORAL | 1 refills | Status: DC

## 2020-05-23 NOTE — Assessment & Plan Note (Signed)
A1c today 6.4.  Currently on metformin 500 mg twice daily.  Urine microalbumin normal. - Continue metformin regimen - Next A1c check 6 months to 1 year

## 2020-05-23 NOTE — Assessment & Plan Note (Signed)
Patient not fasting at time of appointment.  Will ask to return to complete blood work at her convenience. - CMP, lipid panel, CBC, TSH, hep C screening

## 2020-06-02 ENCOUNTER — Ambulatory Visit: Payer: Medicaid Other | Admitting: Family Medicine

## 2020-06-09 ENCOUNTER — Other Ambulatory Visit: Payer: Medicaid Other

## 2020-06-09 ENCOUNTER — Other Ambulatory Visit: Payer: Self-pay

## 2020-06-09 DIAGNOSIS — O24419 Gestational diabetes mellitus in pregnancy, unspecified control: Secondary | ICD-10-CM

## 2020-06-09 DIAGNOSIS — R7303 Prediabetes: Secondary | ICD-10-CM

## 2020-06-09 DIAGNOSIS — Z6831 Body mass index (BMI) 31.0-31.9, adult: Secondary | ICD-10-CM

## 2020-06-09 DIAGNOSIS — Z0000 Encounter for general adult medical examination without abnormal findings: Secondary | ICD-10-CM

## 2020-06-09 DIAGNOSIS — Z8632 Personal history of gestational diabetes: Secondary | ICD-10-CM

## 2020-06-09 DIAGNOSIS — Z1159 Encounter for screening for other viral diseases: Secondary | ICD-10-CM

## 2020-06-10 LAB — COMPREHENSIVE METABOLIC PANEL
ALT: 22 IU/L (ref 0–32)
AST: 13 IU/L (ref 0–40)
Albumin/Globulin Ratio: 1.5 (ref 1.2–2.2)
Albumin: 4.2 g/dL (ref 3.8–4.8)
Alkaline Phosphatase: 60 IU/L (ref 44–121)
BUN/Creatinine Ratio: 20 (ref 9–23)
BUN: 12 mg/dL (ref 6–20)
Bilirubin Total: 0.3 mg/dL (ref 0.0–1.2)
CO2: 22 mmol/L (ref 20–29)
Calcium: 9.2 mg/dL (ref 8.7–10.2)
Chloride: 102 mmol/L (ref 96–106)
Creatinine, Ser: 0.61 mg/dL (ref 0.57–1.00)
Globulin, Total: 2.8 g/dL (ref 1.5–4.5)
Glucose: 121 mg/dL — ABNORMAL HIGH (ref 65–99)
Potassium: 4.2 mmol/L (ref 3.5–5.2)
Sodium: 139 mmol/L (ref 134–144)
Total Protein: 7 g/dL (ref 6.0–8.5)
eGFR: 117 mL/min/{1.73_m2} (ref >59)

## 2020-06-10 LAB — LIPID PANEL
Chol/HDL Ratio: 4.1 ratio (ref 0.0–4.4)
Cholesterol, Total: 114 mg/dL (ref 100–199)
HDL: 28 mg/dL — ABNORMAL LOW (ref >39)
LDL Chol Calc (NIH): 63 mg/dL (ref 0–99)
Triglycerides: 129 mg/dL (ref 0–149)
VLDL Cholesterol Cal: 23 mg/dL (ref 5–40)

## 2020-06-10 LAB — HCV INTERPRETATION

## 2020-06-10 LAB — CBC
Hematocrit: 37.1 % (ref 34.0–46.6)
Hemoglobin: 12.2 g/dL (ref 11.1–15.9)
MCH: 27.9 pg (ref 26.6–33.0)
MCHC: 32.9 g/dL (ref 31.5–35.7)
MCV: 85 fL (ref 79–97)
Platelets: 308 10*3/uL (ref 150–450)
RBC: 4.38 x10E6/uL (ref 3.77–5.28)
RDW: 13.6 % (ref 11.7–15.4)
WBC: 7.5 10*3/uL (ref 3.4–10.8)

## 2020-06-10 LAB — HCV AB W REFLEX TO QUANT PCR: HCV Ab: <0.1 s/co ratio (ref 0.0–0.9)

## 2020-09-02 ENCOUNTER — Encounter: Payer: Self-pay | Admitting: Family Medicine

## 2020-09-02 ENCOUNTER — Ambulatory Visit: Payer: Medicaid Other | Admitting: Family Medicine

## 2020-09-02 ENCOUNTER — Other Ambulatory Visit: Payer: Self-pay

## 2020-09-02 VITALS — BP 110/68 | Ht 62.0 in | Wt 173.0 lb

## 2020-09-02 DIAGNOSIS — R233 Spontaneous ecchymoses: Secondary | ICD-10-CM | POA: Diagnosis not present

## 2020-09-02 DIAGNOSIS — D229 Melanocytic nevi, unspecified: Secondary | ICD-10-CM | POA: Diagnosis not present

## 2020-09-02 NOTE — Assessment & Plan Note (Signed)
Mole on face with recent changes in color and bleeding. DDX include benign mole and less likely melanoma or BCC. Explained to patient that we do not typically do biopsies and removal of lesions on the face and given her age I would recommend referral to dermatology. Pt is happy with this plan. Recommended pt wears sunscreen to sun exposed areas daily.

## 2020-09-02 NOTE — Patient Instructions (Addendum)
Thank you for coming to see me today. It was a pleasure. Today we discussed your mole. It is unclear whether it is cancerous vs benign. We would truly know unless we do a biopsy. Overall it looks normal to me.  Given then it is on the face, I recommend referring to a dermatologist.   Address: [address], [place], [place] [address] Phone: [phone]  Follow up with PCP as needed.   If you have any questions or concerns, please do not hesitate to call the office at [phone].  Best wishes,   Dr Posey Pronto

## 2020-09-02 NOTE — Progress Notes (Signed)
     SUBJECTIVE:   CHIEF COMPLAINT / HPI:   Catherine Houston is a 40 y.o. female presents for mole on face  Mole  Mole is located on right cheek. She has had it since she was very young. Initially it was flat and brown. Has become more "bumpy" over the last 15-20 years.  Asymmetry-none. shape: round, color:pink  diameter 0.3cm. Evolution-bled x 2 times in the last few years, not grown in size. Denies personal or fhx of skin cancers. Pt would like it removed. Wears sunscreen sometimes.   [place] Office Visit from 09/02/2020 in [place]  PHQ-9 Total Score 0         PERTINENT  PMH / PSH: prediabetes  OBJECTIVE:   BP 110/68   Ht '5\' 2"'$  (1.575 m)   Wt 173 lb (78.5 kg)   LMP 08/09/2020   BMI 31.64 kg/m    General: Alert, no acute distress Cardio: well perfused Pulm: normal work of breathing Neuro: Cranial nerves grossly intact      ASSESSMENT/PLAN:   Change in mole Mole on face with recent changes in color and bleeding. DDX include benign mole and less likely melanoma or BCC. Explained to patient that we do not typically do biopsies and removal of lesions on the face and given her age I would recommend referral to dermatology. Pt is happy with this plan. Recommended pt wears sunscreen to sun exposed areas daily.      Lattie Haw, MD PGY-3 [place]

## 2020-10-14 ENCOUNTER — Encounter: Payer: Self-pay | Admitting: Family Medicine

## 2020-10-20 ENCOUNTER — Encounter: Payer: Self-pay | Admitting: Family Medicine

## 2020-10-20 DIAGNOSIS — Z6831 Body mass index (BMI) 31.0-31.9, adult: Secondary | ICD-10-CM

## 2020-11-27 ENCOUNTER — Encounter: Payer: Self-pay | Admitting: Family Medicine

## 2020-11-27 ENCOUNTER — Ambulatory Visit: Payer: Medicaid Other | Admitting: Family Medicine

## 2020-11-27 ENCOUNTER — Other Ambulatory Visit: Payer: Self-pay

## 2020-11-27 VITALS — BP 126/82 | HR 67 | Ht 62.0 in | Wt 173.4 lb

## 2020-11-27 DIAGNOSIS — R7303 Prediabetes: Secondary | ICD-10-CM

## 2020-11-27 DIAGNOSIS — Z6831 Body mass index (BMI) 31.0-31.9, adult: Secondary | ICD-10-CM

## 2020-11-27 DIAGNOSIS — E669 Obesity, unspecified: Secondary | ICD-10-CM

## 2020-11-27 LAB — POCT GLYCOSYLATED HEMOGLOBIN (HGB A1C): HbA1c, POC (controlled diabetic range): 6.4 % (ref 0.0–7.0)

## 2020-11-27 MED ORDER — MOUNJARO 2.5 MG/0.5ML ~~LOC~~ SOAJ: 2.5000 mg | SUBCUTANEOUS | 0 refills | Status: DC

## 2020-11-27 MED ORDER — SEMAGLUTIDE(0.25 OR 0.5MG/DOS) 2 MG/1.5ML ~~LOC~~ SOPN: 0.2500 mg | PEN_INJECTOR | SUBCUTANEOUS | 0 refills | Status: DC

## 2020-11-27 NOTE — Patient Instructions (Addendum)
It was great seeing you today!  Your A1c today is 6.4 so still in prediabetic range. For your weight loss I recommend calling [facility] to get on their wait list: [phone]  I have prescribed Mounjaro 2.5 mg once weekly as the starting dose and we will increase the dose monthly as tolerated.  I do recommend going on the website to check for discount cards.  If it is still too expensive, we can try Ozempic.  Please check-out at the front desk before leaving the clinic. We would like to see you back in after you have been on the medication for about 2-3 weeks to check how you are tolerating it before we increase the do se, but if you need to be seen earlier than that for any new issues we're happy to fit you in, just give [place] a call!  Visit Reminders: - Stop by the pharmacy to pick up your prescriptions  - Continue to work on your healthy eating habits and incorporating exercise into your daily life.   Feel free to call with any questions or concerns at any time, at [phone].   Take care,  Dr. Shary Key [facility]   Tirzepatide Injection What is this medication? TIRZEPATIDE (tir ZEP a tide) treats type 2 diabetes. It works by increasing insulin levels in your body, which decreases your blood sugar (glucose). Changes to diet and exercise are often combined with this medication. This medicine may be used for other purposes; ask your health care provider or pharmacist if you have questions. COMMON BRAND NAME(S): MOUNJARO What should I tell my care team before I take this medication? They need to know if you have any of these conditions: Endocrine tumors (MEN 2) or if someone in your family had these tumors Eye disease, vision problems Gallbladder disease History of pancreatitis Kidney disease Stomach or intestine problems Thyroid cancer or if someone in your family had thyroid cancer An unusual or allergic reaction to tirzepatide,  other medications, foods, dyes, or preservatives Pregnant or trying to get pregnant Breast-feeding How should I use this medication? This medication is injected under the skin. You will be taught how to prepare and give it. It is given once every week (every 7 days). Keep taking it unless your health care provider tells you to stop. If you use this medication with insulin, you should inject this medication and the insulin separately. Do not mix them together. Do not give the injections right next to each other. Change (rotate) injection sites with each injection. This medication comes with INSTRUCTIONS FOR USE. Ask your pharmacist for directions on how to use this medication. Read the information carefully. Talk to your pharmacist or care team if you have questions. It is important that you put your used needles and syringes in a special sharps container. Do not put them in a trash can. If you do not have a sharps container, call your pharmacist or care team to get one. A special MedGuide will be given to you by the pharmacist with each prescription and refill. Be sure to read this information carefully each time. Talk to your care team about the use of this medication in children. Special care may be needed. Overdosage: If you think you have taken too much of this medicine contact a poison control center or emergency room at once. NOTE: This medicine is only for you. Do not share this medicine with others. What if I miss a dose?  If you miss a dose, take it as soon as you can unless it is more than 4 days (96 hours) late. If it is more than 4 days late, skip the missed dose. Take the next dose at the normal time. Do not take 2 doses within 3 days of each other. What may interact with this medication? Alcohol containing beverages Antiviral medications for HIV or AIDS Aspirin and aspirin-like medications Beta-blockers like atenolol, metoprolol, propranolol Certain medications for blood pressure,  heart disease, irregular heart beat Chromium Clonidine Diuretics Female hormones, such as estrogens or progestins, birth control pills Fenofibrate Gemfibrozil Guanethidine Isoniazid Lanreotide Female hormones or anabolic steroids MAOIs like Carbex, Eldepryl, Marplan, Nardil, and Parnate Medications for weight loss Medications for allergies, asthma, cold, or cough Medications for depression, anxiety, or psychotic disturbances Niacin Nicotine NSAIDs, medications for pain and inflammation, like ibuprofen or naproxen Octreotide Other medications for diabetes, like glyburide, glipizide, or glimepiride Pasireotide Pentamidine Phenytoin Probenecid Quinolone antibiotics such as ciprofloxacin, levofloxacin, ofloxacin Reserpine Some herbal dietary supplements Steroid medications such as prednisone or cortisone Sulfamethoxazole; trimethoprim Thyroid hormones Warfarin This list may not describe all possible interactions. Give your health care provider a list of all the medicines, herbs, non-prescription drugs, or dietary supplements you use. Also tell them if you smoke, drink alcohol, or use illegal drugs. Some items may interact with your medicine. What should I watch for while using this medication? Visit your care team for regular checks on your progress. Drink plenty of fluids while taking this medication. Check with your care team if you get an attack of severe diarrhea, nausea, and vomiting. The loss of too much body fluid can make it dangerous for you to take this medication. A test called the HbA1C (A1C) will be monitored. This is a simple blood test. It measures your blood sugar control over the last 2 to 3 months. You will receive this test every 3 to 6 months. Learn how to check your blood sugar. Learn the symptoms of low and high blood sugar and how to manage them. Always carry a quick-source of sugar with you in case you have symptoms of low blood sugar. Examples include hard  sugar candy or glucose tablets. Make sure others know that you can choke if you eat or drink when you develop serious symptoms of low blood sugar, such as seizures or unconsciousness. They must get medical help at once. Tell your care team if you have high blood sugar. You might need to change the dose of your medication. If you are sick or exercising more than usual, you might need to change the dose of your medication. Do not skip meals. Ask your care team if you should avoid alcohol. Many nonprescription cough and cold products contain sugar or alcohol. These can affect blood sugar. Pens should never be shared. Even if the needle is changed, sharing may result in passing of viruses like hepatitis or HIV. Wear a medical ID bracelet or chain, and carry a card that describes your disease and details of your medication and dosage times. Birth control may not work properly while you are taking this medication. If you take birth control pills by mouth, your care team may recommend another type of birth control for 4 weeks after you start this medication and for 4 weeks after each increase in your dose of this medication. Ask your care team which birth control methods you should use. What side effects may I notice from receiving this medication? Side effects that you  should report to your care team as soon as possible: Allergic reactions-skin rash, itching, hives, swelling of the face, lips, tongue, or throat Change in vision Dehydration-increased thirst, dry mouth, feeling faint or lightheaded, headache, dark yellow or brown urine Gallbladder problems-severe stomach pain, nausea, vomiting, fever Kidney injury-decrease in the amount of urine, swelling of the ankles, hands, or feet Pancreatitis-severe stomach pain that spreads to your back or gets worse after eating or when touched, fever, nausea, vomiting Thyroid cancer-new mass or lump in the neck, pain or trouble swallowing, trouble breathing,  hoarseness Side effects that usually do not require medical attention (report these to your care team if they continue or are bothersome): Constipation Diarrhea Loss of Appetite Nausea Stomach pain Upset stomach Vomiting This list may not describe all possible side effects. Call your doctor for medical advice about side effects. You may report side effects to FDA at 1-800-FDA-1088. Where should I keep my medication? Keep out of the reach of children and pets. Refrigeration (preferred): Store unopened pens in a refrigerator between 2 and 8 degrees C (36 and 46 degrees F). Keep it in the original carton until you are ready to take it. Do not freeze or use if the medication has been frozen. Protect from light. Get rid of any unused medication after the expiration date on the label. Room Temperature: The pen may be stored at room temperature below 30 degrees C (86 degrees F) for up to a total of 21 days if needed. Protect from light. Avoid exposure to extreme heat. If it is stored at room temperature, throw away any unused medication after 21 days or after it expires, whichever is first. The pen has glass parts. Handle it carefully. If you drop the pen on a hard surface, do not use it. Use a new pen for your injection. To get rid of medications that are no longer needed or have expired: Take the medication to a medication take-back program. Check with your pharmacy or law enforcement to find a location. If you cannot return the medication, ask your pharmacist or care team how to get rid of this medication safely. NOTE: This sheet is a summary. It may not cover all possible information. If you have questions about this medicine, talk to your doctor, pharmacist, or health care provider.  2022 Elsevier/Gold Standard (2020-06-10 13:57:48)

## 2020-11-27 NOTE — Progress Notes (Signed)
    SUBJECTIVE:   CHIEF COMPLAINT / HPI:   Abiha is a 40 yo who presents for follow up on diabetes. She had gestational diabetes, now prediabetic.  Last A1c was 6 months ago and was 6.1. Has been taking metformin 500 mg twice daily and tolerating well. Doing well today, asymptomatic. She does express concerns about her weight and that it has been hard to lose weight after having her child despite working out and somewhat monitoring diet. Interested in medication.     OBJECTIVE:   BP 126/82   Pulse 67   Ht 5\' 2"  (1.575 m)   Wt 173 lb 6.4 oz (78.7 kg)   LMP 11/15/2020 (Approximate)   SpO2 100%   BMI 31.72 kg/m    Physical exam  General: well appearing, NAD Cardiovascular: RRR, no murmurs Lungs: CTAB. Normal WOB Abdomen: soft, non-distended, non-tender Skin: warm, dry. No edema  ASSESSMENT/PLAN:   No problem-specific Assessment & Plan notes found for this encounter.   Prediabetes A1c today 6.4, slightly up from 6.1 6 months ago. Currently on Metformin 500mg  BID. Patient would benefit from GLP-1 given increased a1c and weight not improved on Metformin. Discussed in detail Mounjaro vs Ozempic and patient would like to start either. Also discussed using medication in combination with diet and exercise for best results. Tried getting approval but insurance declined so opting for Ozempic 2.5mg  weekly for 4 weeks. Will increase dose at next refill if tolerating medication. Discussed side effects and how to administer. Patient in agreement with plan and will follow up in 2-3 weeks.   [place]

## 2020-12-11 ENCOUNTER — Ambulatory Visit (INDEPENDENT_AMBULATORY_CARE_PROVIDER_SITE_OTHER): Payer: Medicaid Other | Admitting: Family Medicine

## 2020-12-11 ENCOUNTER — Other Ambulatory Visit: Payer: Self-pay

## 2020-12-11 VITALS — BP 121/70 | HR 70 | Ht 62.0 in | Wt 170.2 lb

## 2020-12-11 DIAGNOSIS — Z6831 Body mass index (BMI) 31.0-31.9, adult: Secondary | ICD-10-CM | POA: Diagnosis not present

## 2020-12-11 DIAGNOSIS — R7303 Prediabetes: Secondary | ICD-10-CM | POA: Diagnosis not present

## 2020-12-11 DIAGNOSIS — E669 Obesity, unspecified: Secondary | ICD-10-CM | POA: Diagnosis present

## 2020-12-11 NOTE — Patient Instructions (Signed)
It was great seeing you today!  Today we checked on how you are feeling with you Ozempic which I am glad you are tolerating it well! We will increase your dose to 0.5mg  for your next refill.   Please check-out at the front desk before leaving the clinic. I'd like to see you back in 2 months for your physical and medication check, but if you need to be seen earlier than that for any new issues we're happy to fit you in, just give [place] a call!  Feel free to call with any questions or concerns at any time, at [phone].   Take care,  Dr. Shary Key [facility]

## 2020-12-11 NOTE — Progress Notes (Signed)
    SUBJECTIVE:   CHIEF COMPLAINT / HPI:   Catherine Houston is a 40 yo who presents for follow up after starting Ozempic about 2 weeks ago. She states she has been feeling well and without nausea or other side effects. She does occasionally feel some heart burn not sure if related to medication, but it resolves after OTC reflux meds. She is down 3 pounds since starting it. She endorses feeling less cravings and is also glad that insurance covers it and it is very affordable .    OBJECTIVE:   BP 121/70   Pulse 70   Ht 5\' 2"  (1.575 m)   Wt 170 lb 3.2 oz (77.2 kg)   LMP 11/15/2020 (Approximate)   SpO2 97%   BMI 31.13 kg/m    General: alert, pleasant, NAD Resp: CTAB normal WOB CV: RRR no murmurs GI: soft, non distended Derm: warm, dry, well perfused   ASSESSMENT/PLAN:   No problem-specific Assessment & Plan notes found for this encounter.   Pre-diabetes, obesity A1c 6.4 2 weeks ago. BMI 31. Started her on Ozempic .25mg  2 weeks ago for elevated blood sugar and added benefit of weight loss. She has been tolerating it well and without nausea or other side effects. Lost 3 lbs since starting medication, and feels well with a reduction in cravings. Will refill Ozempic at the next increased dose of .5mg  weekly. Will continue to titrate medication as tolerated. Recommended follow up in about 2 months for a1c and weight check.   [place]

## 2020-12-14 MED ORDER — OZEMPIC (0.25 OR 0.5 MG/DOSE) 2 MG/1.5ML ~~LOC~~ SOPN: 0.5000 mg | PEN_INJECTOR | SUBCUTANEOUS | 0 refills | Status: DC

## 2020-12-25 ENCOUNTER — Other Ambulatory Visit: Payer: Self-pay | Admitting: Obstetrics and Gynecology

## 2020-12-25 ENCOUNTER — Other Ambulatory Visit: Payer: Self-pay | Admitting: Family Medicine

## 2020-12-25 DIAGNOSIS — O24410 Gestational diabetes mellitus in pregnancy, diet controlled: Secondary | ICD-10-CM

## 2020-12-25 DIAGNOSIS — R7303 Prediabetes: Secondary | ICD-10-CM

## 2020-12-29 ENCOUNTER — Telehealth: Payer: Self-pay

## 2020-12-29 NOTE — Telephone Encounter (Signed)
Patient calls nurse line reporting supply shortage of Ozempic. Patient reports she has called around to "all the pharmacies" without any luck.   Patient is is requesting an alternative in the meantime. Patient is requesting Phentermine, as this has helped her lose weight in the past.   Will forward to provider who saw patient.

## 2020-12-30 NOTE — Telephone Encounter (Signed)
Attempted to call patient. VM left.  

## 2021-01-02 ENCOUNTER — Encounter: Payer: Self-pay | Admitting: *Deleted

## 2021-01-30 ENCOUNTER — Other Ambulatory Visit: Payer: Self-pay | Admitting: Family Medicine

## 2021-02-22 IMAGING — US US MFM FETAL BPP W/O NON-STRESS
1 series · 12 of 18 positions shown · non-contrast
Comparison: none

[Series 1: us mfm fetal bpp w/o non-stress · 18 acquisitions, 12 frames shown]
[im 1/18]
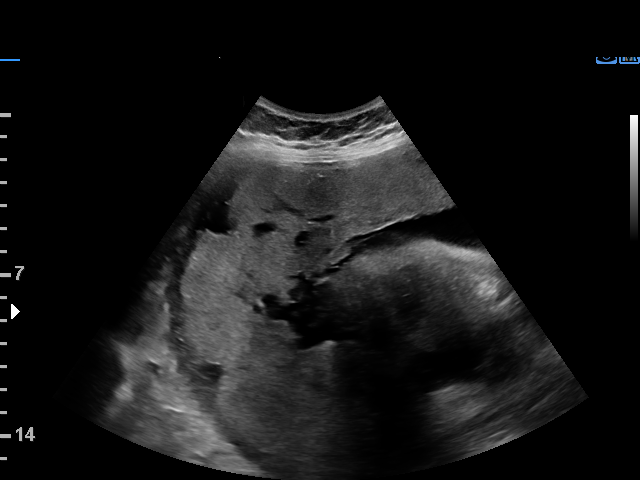
[im 3/18]
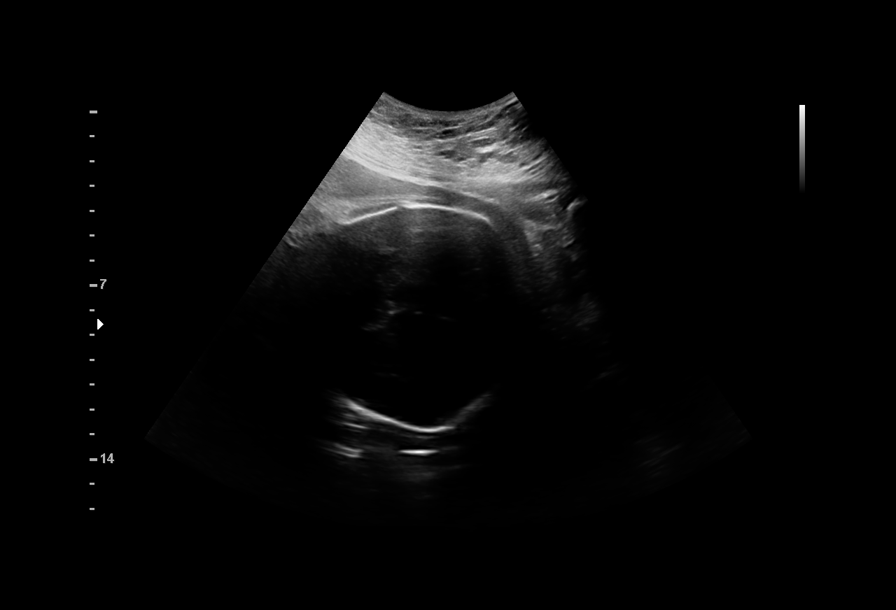
[im 4/18]
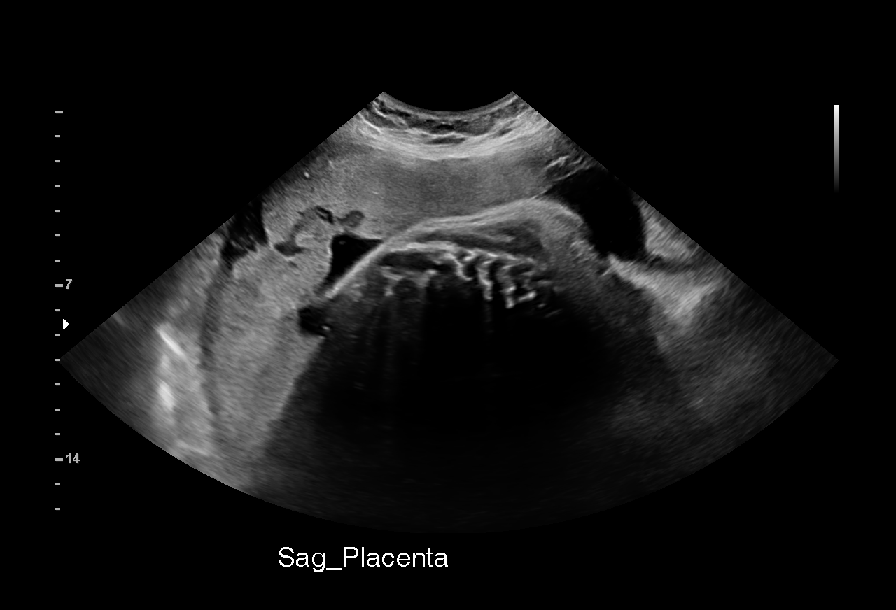
[im 6/18]
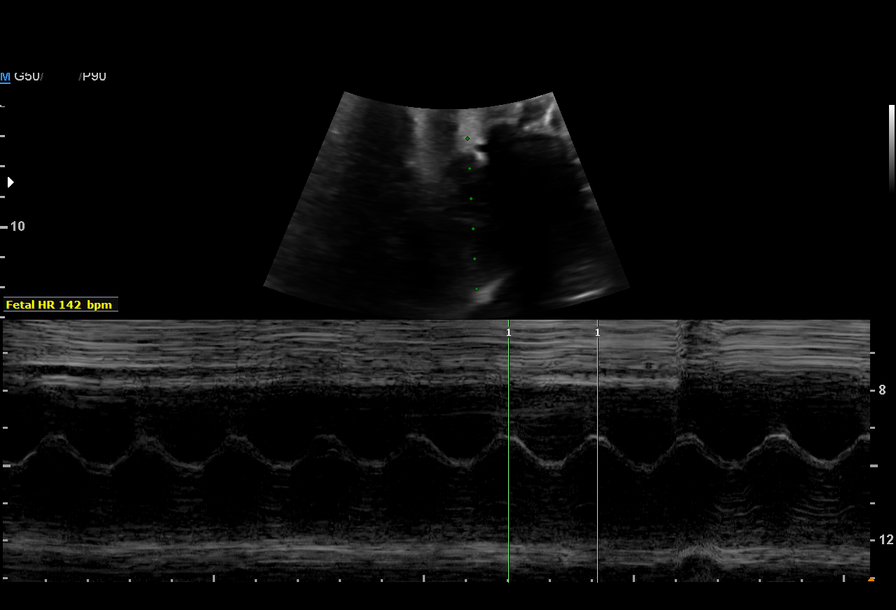
[im 7/18]
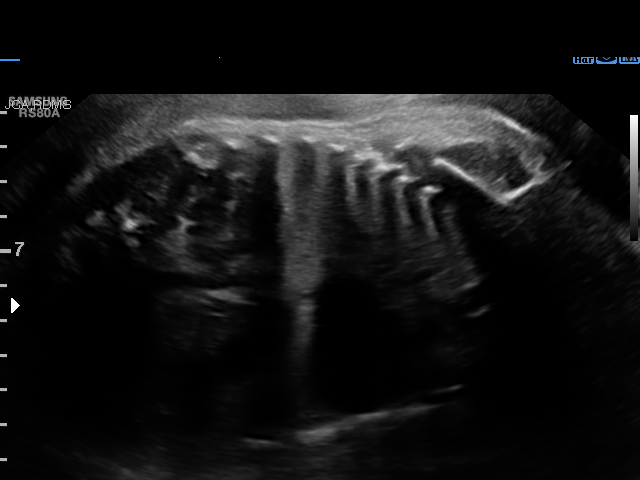
[im 9/18]
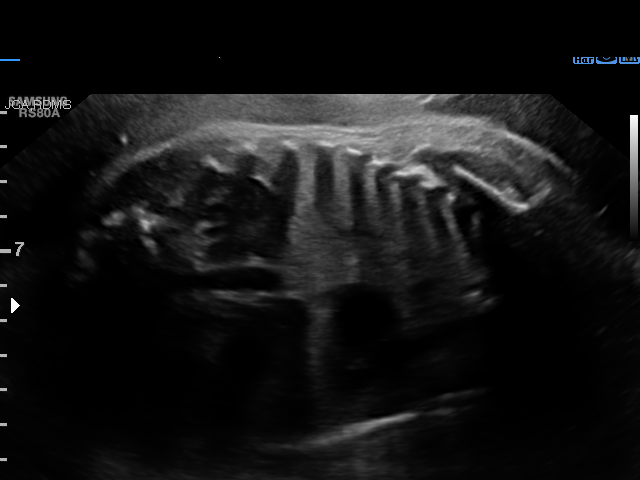
[im 10/18]
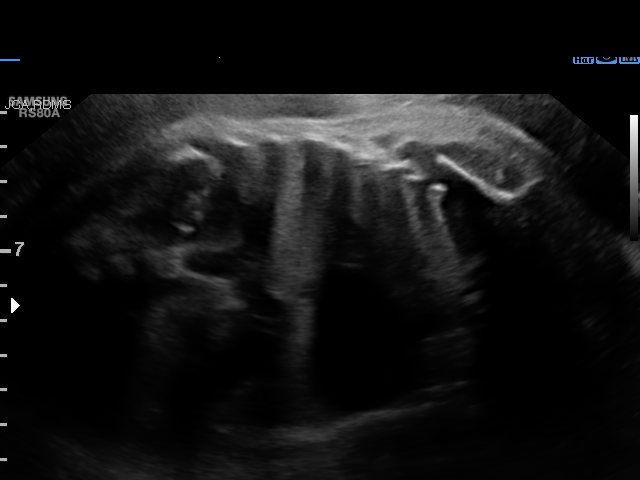
[im 12/18]
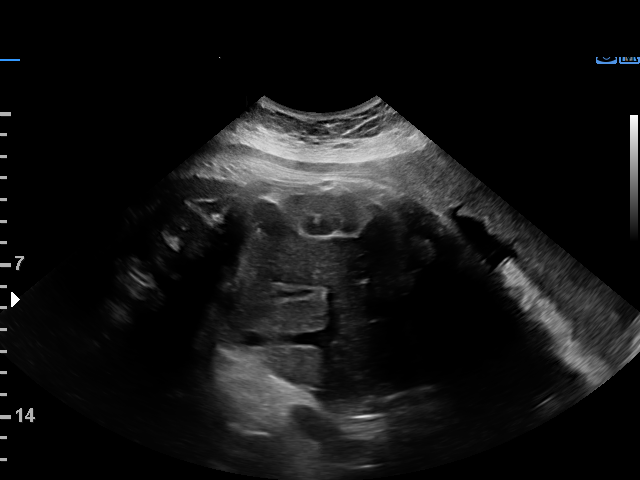
[im 13/18]
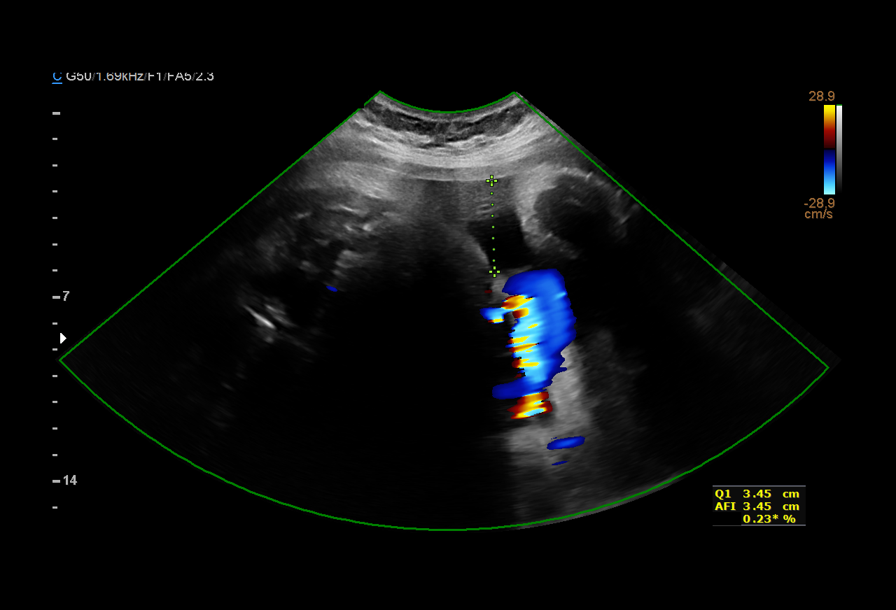
[im 15/18]
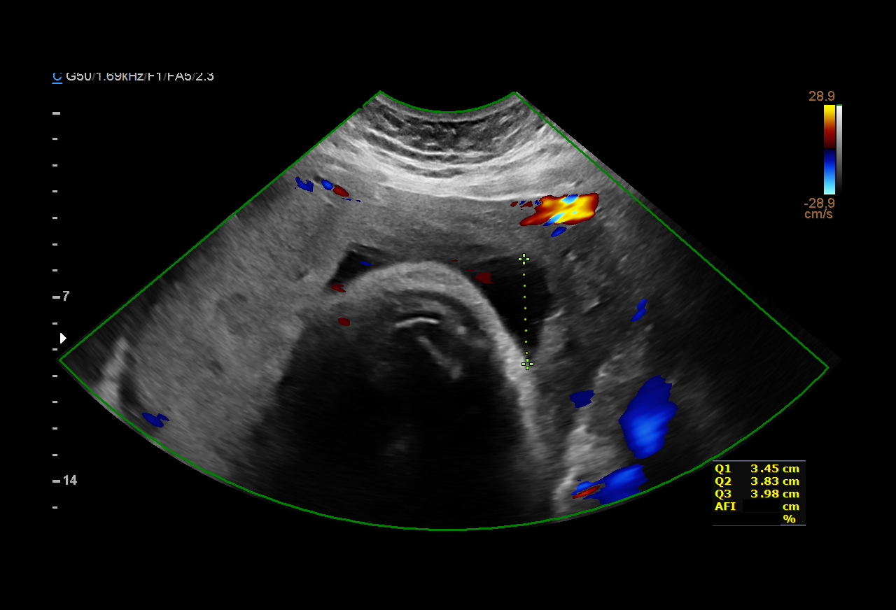
[im 16/18]
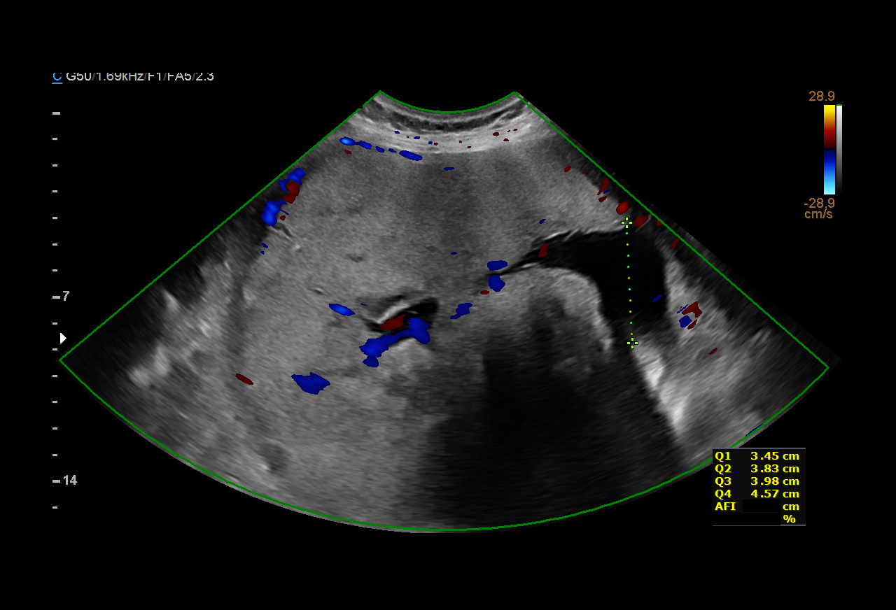
[im 18/18]
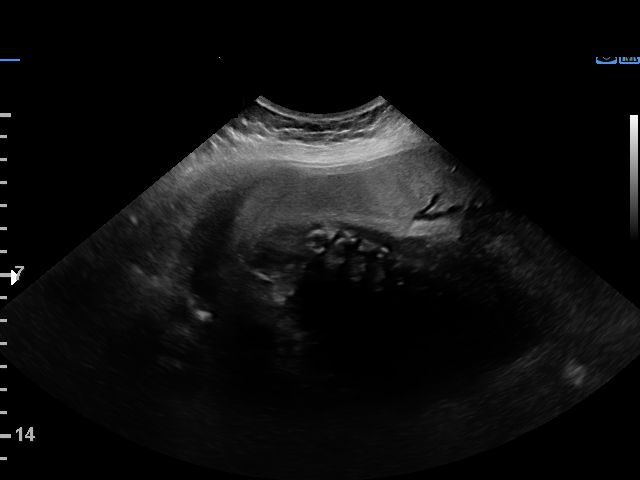

[12 of 18 positions shown; findings below may reference images not displayed]

----------------------------------------------------------------------

 ----------------------------------------------------------------------
Indications

  Gestational diabetes in pregnancy,
  controlled by oral hypoglycemic drugs
  Poor obstetric history: Previous gestational
  diabetes
  Low-risk NIPS
  Previous pregnancy complicated by
  chromosomal abnormality, antepartum (prior
  child with deletion)
  35 weeks gestation of pregnancy
 ----------------------------------------------------------------------
Fetal Evaluation

 Num Of Fetuses:         1
 Fetal Heart Rate(bpm):  142
 Cardiac Activity:       Observed
 Presentation:           Cephalic
 Placenta:               Posterior Fundal
 P. Cord Insertion:      Previously Visualized

 Amniotic Fluid
 AFI FV:      Within normal limits

 AFI Sum(cm)     %Tile       Largest Pocket(cm)
 15.83           58

 RUQ(cm)       RLQ(cm)       LUQ(cm)        LLQ(cm)

Biophysical Evaluation
 Amniotic F.V:   Within normal limits       F. Tone:        Observed
 F. Movement:    Observed                   Score:          [DATE]
 F. Breathing:   Observed
OB History

 Gravidity:    3         Term:   2
 Living:       2
Gestational Age

 LMP:           35w 2d        Date:  08/21/18                 EDD:   05/28/19
 Best:          35w 2d     Det. By:  LMP  (08/21/18)          EDD:   05/28/19
Impression

 Gestational diabetes.  Patient takes glyburide 5 mg in the
 morning and 2.5 mg at night.  She reports her blood glucose
 levels are within normal range.
 Amniotic fluid is normal and good fetal activity seen.
 Antenatal testing is reassuring.  BPP [DATE].
Recommendations

 -Fetal growth assessment next week.
 -Continue weekly BPP till delivery.
                 [place], Felner

## 2021-02-27 NOTE — Patient Instructions (Signed)
It was wonderful to see you today. Thank you for allowing me to be a part of your care. Below is a short summary of what we discussed at your visit today:  Ozempic Continue on the 0.5 mg dose every week. You should have enough refills. Let us know if you have trouble at the pharmacy!  Irregular periods Please come back at your convenience to have the labs drawn. Simply call the front desk ahead of time to schedule your lab only appointment. Be ready to give a blood and urine sample.   Health Maintenance We like to think about ways to keep you healthy for years to come. Below are some interventions and screenings we can offer to keep you healthy: - COVID bivalent booster - This year's influenza vaccine - Your next PAP is due November 2023 (this year)  Please bring all of your medications to every appointment!  If you have any questions or concerns, please do not hesitate to contact us via phone or MyChart message.   Ezequiel Essex, MD

## 2021-02-27 NOTE — Progress Notes (Signed)
SUBJECTIVE:   CHIEF COMPLAINT / HPI:   Presents for "physical" and follow up of new med  Weight management and pre-diabetes - patient started Ozempic early November 2022 - doing well, no concerns, no adverse side effects - A1c improved; today 6.1, down from 6.4 last check - weight today 171.8 lb, down from 173.4 when beginning Ozempic - previously seen at [facility] 12/11/20, provider notes below "Pre-diabetes, obesity A1c 6.4 2 weeks ago. BMI 31. Started her on Ozempic .25mg  2 weeks ago for elevated blood sugar and added benefit of weight loss. She has been tolerating it well and without nausea or other side effects. Lost 3 lbs since starting medication, and feels well with a reduction in cravings. Will refill Ozempic at the next increased dose of .5mg  weekly. Will continue to titrate medication as tolerated. Recommended follow up in about 2 months for a1c and weight check."  Health Maintenance - Eligible for bivalent COVID, last COVID shot 07/03/2020 - Due for this years annual influenza vaccine - System indicates she needs ophthalmology referral for diabetic exam and foot exam today, however patient is pre-diabetic and this is not indicated at this time. Will correct in Epic  Concerns about menses Becoming irregular One or two days of spotty light bleeding before normal period Changes over last 6 months Post coital light bleeding with wiping, goes away by the next day No pain with intercourse No vaginal dryness Mother was closer to 45 years old when she started menopause LMP started today (but light bleeding since Saturday) Plan to do labs 1-2 days after period ends  PERTINENT  PMH / PSH:  Patient Active Problem List   Diagnosis Date Noted   Irregular menses 03/04/2021   Change in mole 09/02/2020   Prediabetes 05/23/2020   Healthcare maintenance 05/23/2020   BMI 31.0-31.9,adult 03/23/2016    OBJECTIVE:   BP 119/71    Pulse 82    Wt 171 lb 12.8 oz (77.9 kg)    LMP 02/28/2021  (Exact Date)    SpO2 99%    BMI 31.42 kg/m   PHQ-9:  Depression screen [facility] 2/9 11/27/2020 09/02/2020 05/23/2020  Decreased Interest 0 0 0  Down, Depressed, Hopeless 0 0 0  PHQ - 2 Score 0 0 0  Altered sleeping 0 0 0  Tired, decreased energy 0 0 0  Change in appetite 0 0 0  Feeling bad or failure about yourself  0 0 0  Trouble concentrating 0 0 0  Moving slowly or fidgety/restless 0 0 0  Suicidal thoughts 0 0 0  PHQ-9 Score 0 0 0  Difficult doing work/chores - Not difficult at all -  Some recent data might be hidden    GAD-7:  GAD 7 : Generalized Anxiety Score 10/26/2016 09/10/2016 08/27/2016 08/20/2016  Nervous, Anxious, on Edge 0 0 0 0  Control/stop worrying 0 0 0 0  Worry too much - different things 0 0 0 0  Trouble relaxing 0 0 0 0  Restless 0 0 0 0  Easily annoyed or irritable 0 0 0 0  Afraid - awful might happen 0 0 0 0  Total GAD 7 Score 0 0 0 0   Physical Exam General: Awake, alert, oriented, no acute distress Respiratory: Unlabored respirations, speaking in full sentences, no respiratory distress Extremities: Moving all extremities spontaneously Neuro: Cranial nerves II through X grossly intact Psych: Normal insight and judgement  ASSESSMENT/PLAN:   Prediabetes Tolerating ozempic well, no adverse side effects. Continue at 0.5  mg weekly dose. No changes. See AVS for more. Next A1c check 6-8 months.   Irregular menses Duration 6-8 months. Patient concerned about abnormality. Patient will return for future labs: urine pregnancy, TSH, FSH, LH, estradiol. Next due for PAP in October of this year. If labs unremarkable, consider earlier PAP. See AVS for more.      Ezequiel Essex, MD [place]

## 2021-03-03 ENCOUNTER — Encounter: Payer: Self-pay | Admitting: Family Medicine

## 2021-03-03 ENCOUNTER — Ambulatory Visit (INDEPENDENT_AMBULATORY_CARE_PROVIDER_SITE_OTHER): Payer: Medicaid Other | Admitting: Family Medicine

## 2021-03-03 ENCOUNTER — Other Ambulatory Visit: Payer: Self-pay

## 2021-03-03 VITALS — BP 119/71 | HR 82 | Wt 171.8 lb

## 2021-03-03 DIAGNOSIS — R7303 Prediabetes: Secondary | ICD-10-CM | POA: Diagnosis not present

## 2021-03-03 DIAGNOSIS — N926 Irregular menstruation, unspecified: Secondary | ICD-10-CM | POA: Diagnosis not present

## 2021-03-03 DIAGNOSIS — Z6831 Body mass index (BMI) 31.0-31.9, adult: Secondary | ICD-10-CM

## 2021-03-03 LAB — POCT GLYCOSYLATED HEMOGLOBIN (HGB A1C): HbA1c, POC (prediabetic range): 6.1 % (ref 5.7–6.4)

## 2021-03-04 DIAGNOSIS — N926 Irregular menstruation, unspecified: Secondary | ICD-10-CM | POA: Insufficient documentation

## 2021-03-04 NOTE — Assessment & Plan Note (Signed)
Tolerating ozempic well, no adverse side effects. Continue at 0.5 mg weekly dose. No changes. See AVS for more. Next A1c check 6-8 months.

## 2021-03-04 NOTE — Assessment & Plan Note (Signed)
Duration 6-8 months. Patient concerned about abnormality. Patient will return for future labs: urine pregnancy, TSH, FSH, LH, estradiol. Next due for PAP in October of this year. If labs unremarkable, consider earlier PAP. See AVS for more.

## 2021-03-16 IMAGING — US US MFM FETAL BPP W/O NON-STRESS
1 series · 15 of 17 positions shown · non-contrast
Comparison: none

[Series 1: us mfm fetal bpp w/o non-stress · 17 acquisitions, 15 frames shown]
[im 1/17]
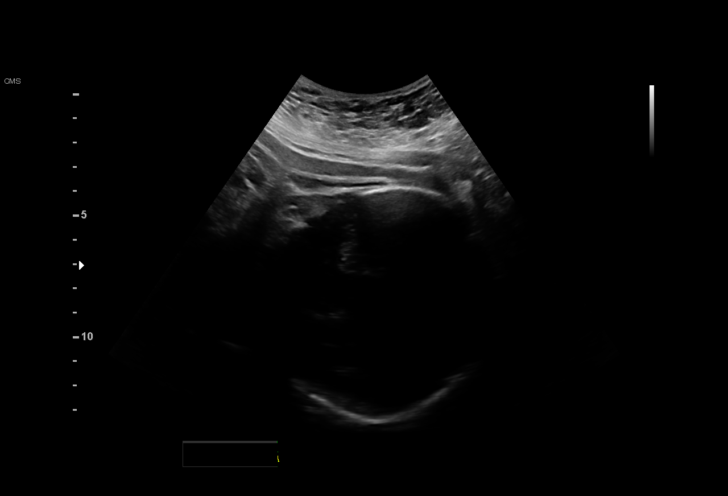
[im 2/17]
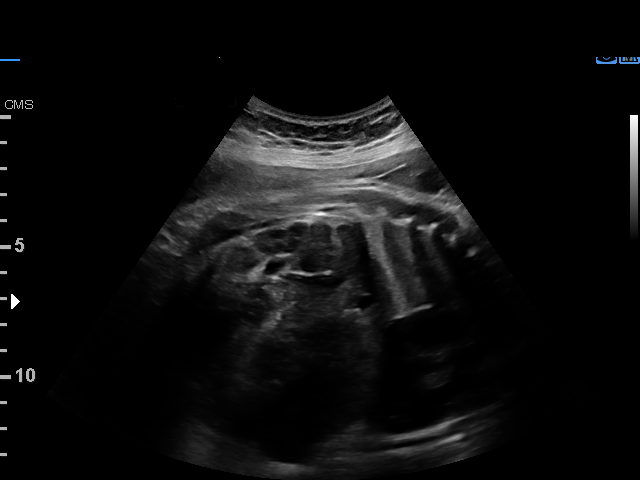
[im 3/17]
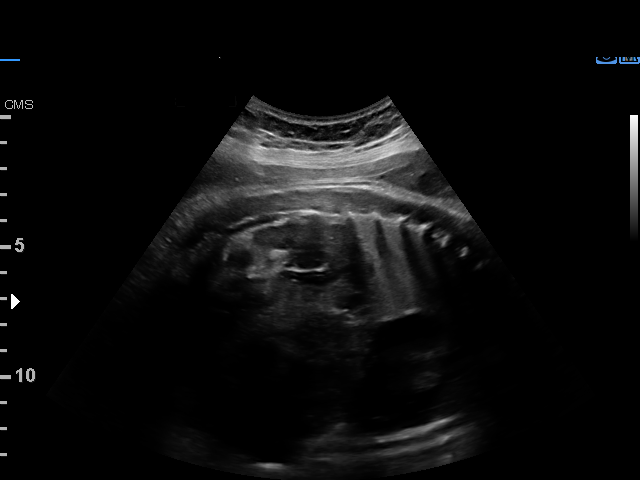
[im 4/17]
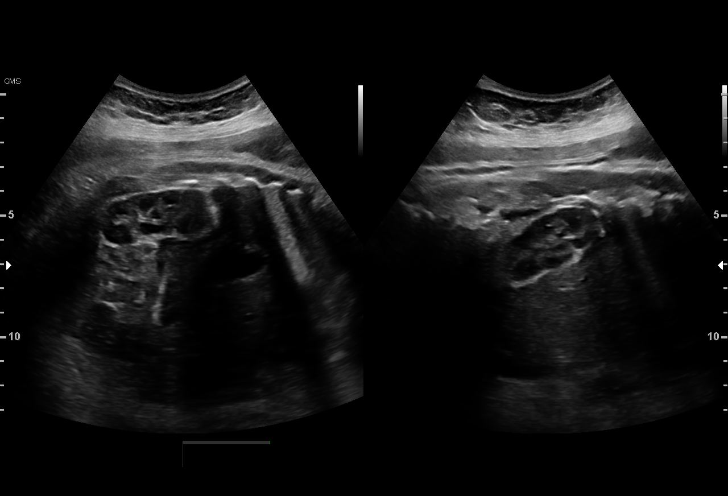
[im 6/17]
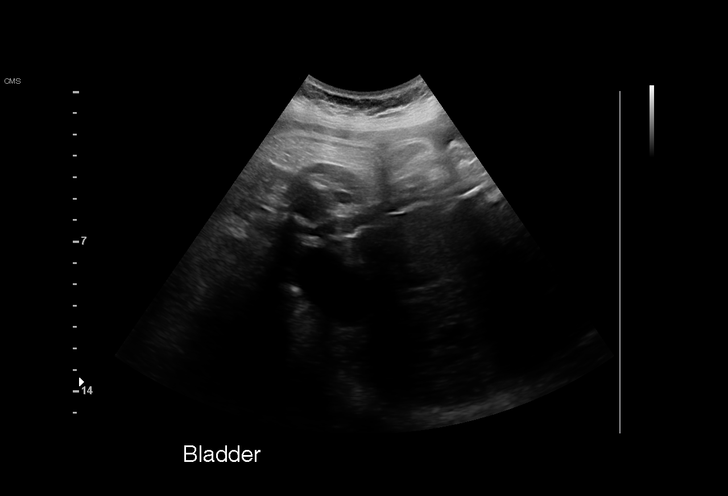
[im 7/17]
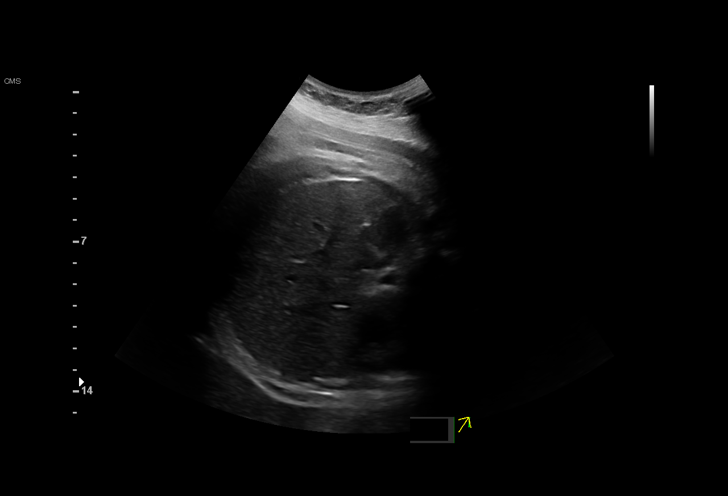
[im 8/17]
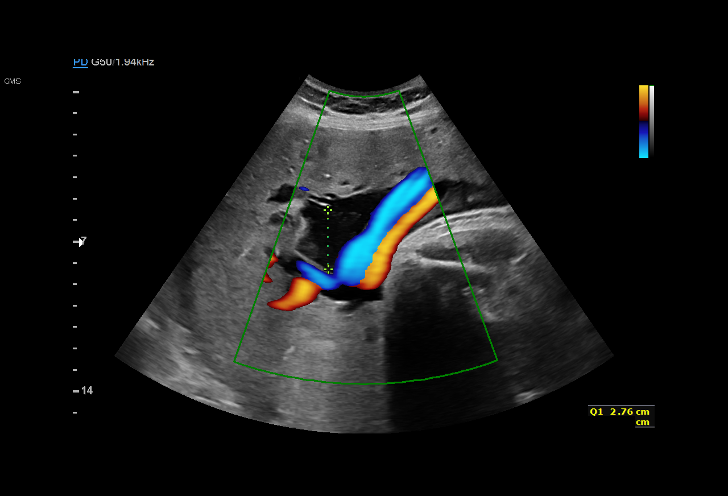
[im 9/17]
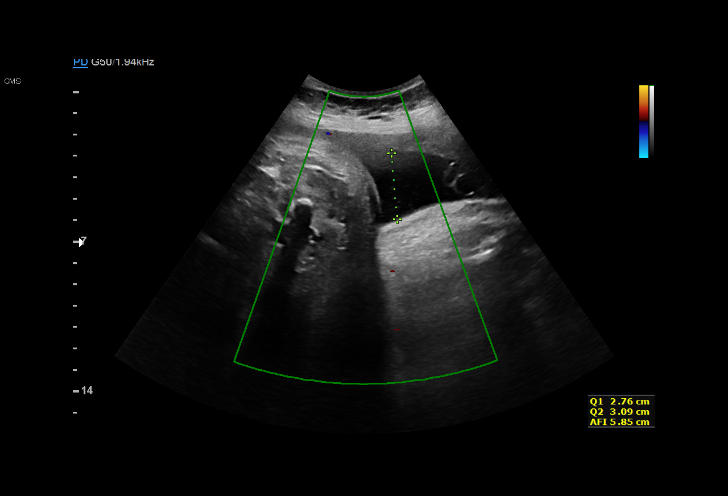
[im 10/17]
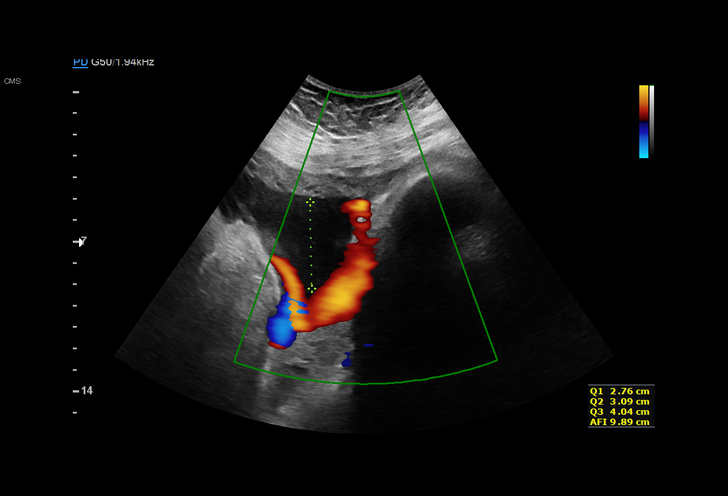
[im 11/17]
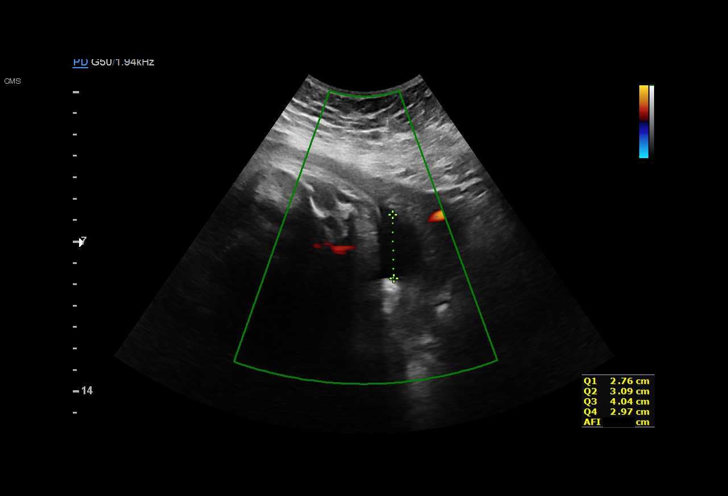
[im 12/17]
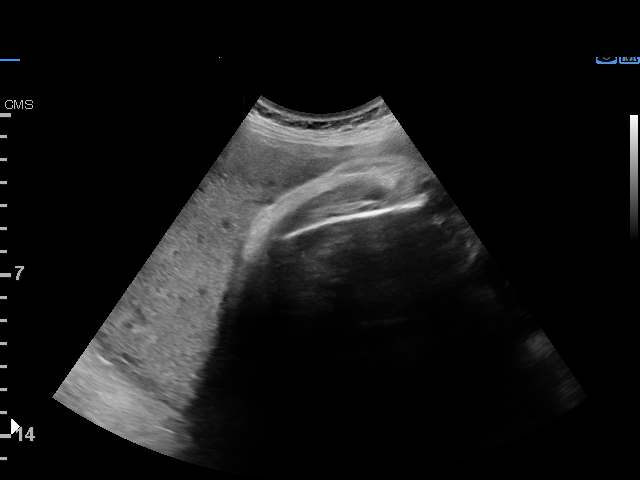
[im 14/17]
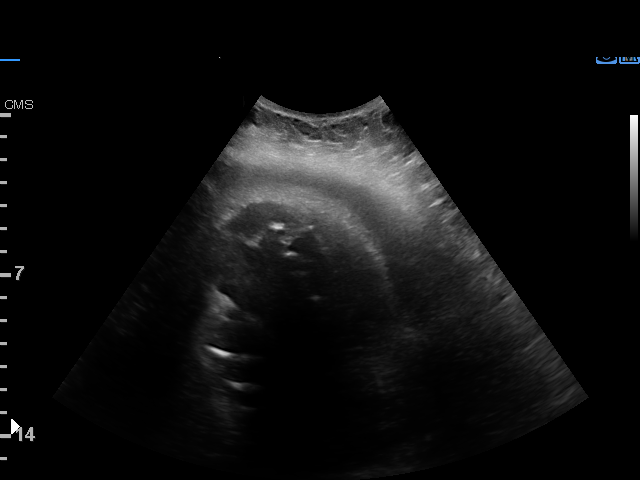
[im 15/17]
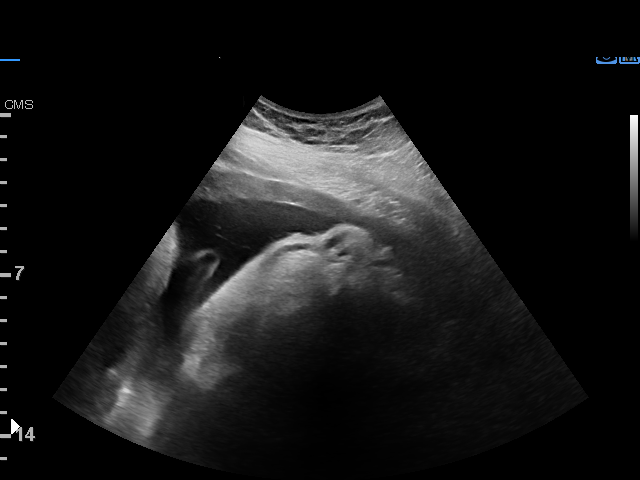
[im 16/17]
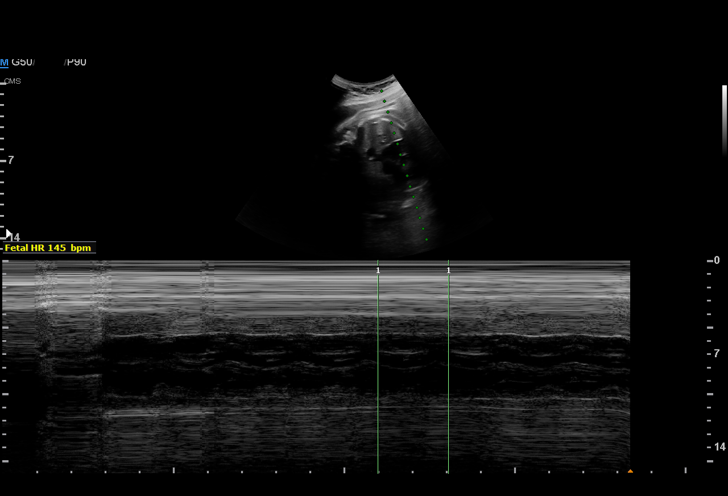
[im 17/17]
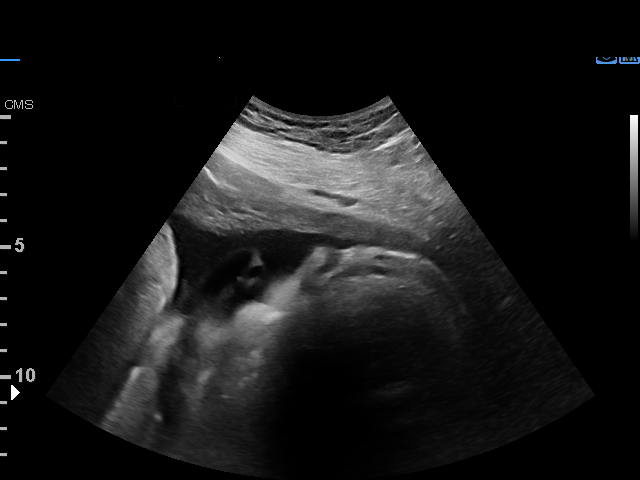

[15 of 17 positions shown; findings below may reference images not displayed]

----------------------------------------------------------------------

 ----------------------------------------------------------------------
Indications

  Gestational diabetes in pregnancy,
  controlled by oral hypoglycemic drugs
  38 weeks gestation of pregnancy
  Advanced maternal age multigravida 35+,
  third trimester (38)
  Poor obstetric history: Previous gestational
  diabetes
  Low-risk NIPS
  Previous pregnancy complicated by
  chromosomal abnormality, antepartum (prior
  child with deletion)
  Obesity complicating pregnancy, third
  trimester
 ----------------------------------------------------------------------
Vital Signs

                                                Height:        5'2"
Fetal Evaluation

 Num Of Fetuses:         1
 Fetal Heart Rate(bpm):  145
 Cardiac Activity:       Observed
 Presentation:           Cephalic

 Amniotic Fluid
 AFI FV:      Within normal limits

 AFI Sum(cm)     %Tile       Largest Pocket(cm)
 12.86           49
 RUQ(cm)       RLQ(cm)       LUQ(cm)        LLQ(cm)

Biophysical Evaluation

 Amniotic F.V:   Within normal limits       F. Tone:        Observed
 F. Movement:    Observed                   Score:          [DATE]
 F. Breathing:   Observed
OB History

 Gravidity:    3         Term:   2
 Living:       2
Gestational Age

 LMP:           38w 3d        Date:  08/21/18                 EDD:   05/28/19
 Best:          38w 3d     Det. By:  LMP  (08/21/18)          EDD:   05/28/19
Comments

 This patient was seen for a biophysical profile due to
 gestational diabetes that is currently treated with Metformin.
 A biophysical profile performed today was [DATE].
 There was normal amniotic fluid noted on today's ultrasound
 exam.
 Due to gestational diabetes, she already has a delivery
 scheduled next week.

## 2021-03-17 ENCOUNTER — Other Ambulatory Visit (INDEPENDENT_AMBULATORY_CARE_PROVIDER_SITE_OTHER): Payer: Medicaid Other

## 2021-03-17 ENCOUNTER — Other Ambulatory Visit: Payer: Self-pay

## 2021-03-17 DIAGNOSIS — N926 Irregular menstruation, unspecified: Secondary | ICD-10-CM | POA: Diagnosis not present

## 2021-03-17 LAB — POCT URINE PREGNANCY: Preg Test, Ur: NEGATIVE

## 2021-03-18 LAB — FSH/LH
FSH: 3.9 m[IU]/mL
LH: 4.5 m[IU]/mL

## 2021-03-18 LAB — ESTRADIOL: Estradiol: 83.6 pg/mL

## 2021-03-18 LAB — TSH: TSH: 1.43 u[IU]/mL (ref 0.450–4.500)

## 2021-04-22 ENCOUNTER — Ambulatory Visit: Payer: Medicaid Other | Admitting: Family Medicine

## 2021-04-22 ENCOUNTER — Encounter: Payer: Self-pay | Admitting: Family Medicine

## 2021-04-22 ENCOUNTER — Other Ambulatory Visit: Payer: Self-pay

## 2021-04-22 VITALS — BP 120/75 | HR 76 | Wt 174.2 lb

## 2021-04-22 DIAGNOSIS — G8929 Other chronic pain: Secondary | ICD-10-CM | POA: Diagnosis not present

## 2021-04-22 DIAGNOSIS — M25511 Pain in right shoulder: Secondary | ICD-10-CM

## 2021-04-22 MED ORDER — NAPROXEN 500 MG PO TABS: 500.0000 mg | ORAL_TABLET | Freq: Two times a day (BID) | ORAL | 1 refills | Status: DC

## 2021-04-22 NOTE — Progress Notes (Signed)
? ? ?  SUBJECTIVE:  ? ?CHIEF COMPLAINT / HPI: shoulder pain during menses  ? ?Patient reports right shoulder pain that only occurs during her menses. She states that it often keeps her from being able to sleep. She has tried Tylenol in the past which used to help but the pain has gotten worse. ?She has difficulty with lifting her right arm above shoulder level. ?She denies recent injuries  ?Denies past surgeries ?Patient reports that she had an MRI 7-8 years ago the shoulder with no abnormalities noted ?Patient denies any history of endometriosis ?She states that her menstrual cramps or manageable ?Patient has not been able to see any changes to her skin during these times of pain ?Patient was previously using NuvaRing and Depo-Provera injections with no improvement in her pain ?She reports that.'s did not change after having her children ?She reports that she has had this issue for several years ? ? ?PERTINENT  PMH / PSH:  ?PreDm  ?Irregular menses  ?BMI 31 ? ?OBJECTIVE:  ? ?BP 120/75   Pulse 76   Wt 174 lb 3.2 oz (79 kg)   LMP 04/22/2021 (Exact Date)   SpO2 99%   BMI 31.86 kg/m?   ?Right Shoulder: ?Inspection reveals no obvious deformity, atrophy, or asymmetry. No bruising. No swelling ?Palpation is normal with no TTP over [facility] joint or bicipital groove. Tenderness in area of superior scapula ?Full ROM in flexion, abduction, internal/external rotation ?NV intact distally ? ?Special Tests:  ?- Impingement: Neg neers, - empty can sign. ?- Supraspinatous: Negative empty can.  5/5 strength with resisted flexion at 20 degrees ?- Infraspinatous/[place]: 5/5 strength with ER. Negative pain with resisted ER ?- Subscapularis:  5/5 strength with IR, +pain with resisted IR ?- AC Joint: Negative cross arm ?- No painful arc and no drop arm sign ? ? ?ASSESSMENT/PLAN:  ? ?Chronic right shoulder pain ?Right posterior shoulder pain associated with menses only ?No history of endometriosis ?MRI reviewed with no abnormalities  noted several years ago ?We will prescribe twice daily naproxen 3 days before menses, throughout and for 3 days following menses ?  ? ? ?Eulis Foster, MD ?[place]  ?

## 2021-04-22 NOTE — Assessment & Plan Note (Signed)
Right posterior shoulder pain associated with menses only ?No history of endometriosis ?MRI reviewed with no abnormalities noted several years ago ?We will prescribe twice daily naproxen 3 days before menses, throughout and for 3 days following menses ? ?

## 2021-04-22 NOTE — Patient Instructions (Signed)
For your shoulder pain, I have prescribed naproxen for you to take twice daily 3 days before your period begins, throughout your period As well as 3 days after your period.  Please take this medication with food. ? ?Also recommend using a heating pad on the area to assist with pain. ? ? ?

## 2021-04-30 ENCOUNTER — Other Ambulatory Visit (HOSPITAL_COMMUNITY): Payer: Self-pay

## 2021-05-05 ENCOUNTER — Other Ambulatory Visit (HOSPITAL_COMMUNITY): Payer: Self-pay

## 2021-05-20 ENCOUNTER — Encounter: Payer: Self-pay | Admitting: Family Medicine

## 2021-05-20 ENCOUNTER — Ambulatory Visit: Payer: Medicaid Other | Admitting: Family Medicine

## 2021-05-20 VITALS — BP 122/78 | HR 73 | Ht 62.0 in | Wt 171.4 lb

## 2021-05-20 DIAGNOSIS — Z01818 Encounter for other preprocedural examination: Secondary | ICD-10-CM

## 2021-05-20 DIAGNOSIS — D649 Anemia, unspecified: Secondary | ICD-10-CM | POA: Diagnosis not present

## 2021-05-20 NOTE — Patient Instructions (Signed)
It was wonderful to see you today. Thank you for allowing me to be a part of your care. Below is a short summary of what we discussed at your visit today: ? ?Preoperative lab work ?Today we talked about your surgical history, medications, and conditions.  You are cleared for surgery.  I have signed your form and given it back to you. ? ?Because we are unsure if your insurance will pay for the lab work, EKG, and chest x-ray, I have ordered these things as "future" labs.  They are on file and can be drawn anytime here in the clinic.  Simply call us a day or 2 ahead and let us know when you when to come in to the lab. ? ?For the EKG, you will need to come in for an appointment.  I will be quick, but this is because a physician must read your EKG. ? ? ?Please bring all of your medications to every appointment! ? ?If you have any questions or concerns, please do not hesitate to contact us via phone or MyChart message.  ? ?Ezequiel Essex, MD  ?

## 2021-05-20 NOTE — Progress Notes (Signed)
? ? ?  SUBJECTIVE:  ? ?CHIEF COMPLAINT / HPI:  ? ?Pre-Op clearance ?Patient is a healthy 41 year old female who presents for preop clearance prior to undergoing cosmetic surgery in May with [facility] clinic in [place].  She presents a list of preoperative labs that her surgeon requests. ? ?No prior surgeries ?NKDA to anesthesia or numbing agents ?No family history of allergies, malignant hyperthermia, or adverse anesthesia reactions ?NKDA NKFA ?Reviewed meds - no ASA, goody powder, etc ?History of isolated HTN in pregnancy, not requiring medication ?PMH includes overweight BMI, irregular menses, and prediabetes ?No dental issues, no loose teeth or bridges, retainers, etc ? ?BP Readings from Last 3 Encounters:  ?05/20/21 122/78  ?04/22/21 120/75  ?03/03/21 119/71  ?  ?PERTINENT  PMH / PSH:  ?Patient Active Problem List  ? Diagnosis Date Noted  ? Pre-op evaluation 05/25/2021  ? Chronic right shoulder pain 04/22/2021  ? Irregular menses 03/04/2021  ? Change in mole 09/02/2020  ? Prediabetes 05/23/2020  ? Healthcare maintenance 05/23/2020  ? BMI 31.0-31.9,adult 03/23/2016  ?  ?OBJECTIVE:  ? ?BP 122/78   Pulse 73   Ht '5\' 2"'$  (1.575 m)   Wt 171 lb 6.4 oz (77.7 kg)   LMP 04/19/2021 (Exact Date)   SpO2 99%   BMI 31.35 kg/m?   ? ?Physical Exam ?General: Awake, alert, oriented ?HEENT: Sclera anicteric, oral mucosa pink, moist, without lesion, intact dentition without obvious cavity, Mallampati class 2 ?Cardiovascular: Regular rate and rhythm, S1 and S2 present, no murmurs auscultated ?Respiratory: Lung fields clear to auscultation bilaterally ? ?ASSESSMENT/PLAN:  ? ?Pre-op evaluation ?Patient is cleared for surgery from a medical standpoint.  Today we ordered the following labs at the request of her surgeon, selected as "future": CBC, CMP, beta hCG quant, PT/INR, APTT, TSH with free T4, and CXR.  Patient wanted to contact her insurance prior to getting his labs drawn to see what the price may be.  They are  ordered as future so she may return for what ever labs she chooses.  Preop clearance form completed and given to patient. ?  ? ? ?Ezequiel Essex, MD ?[place]  ?

## 2021-05-21 ENCOUNTER — Encounter: Payer: Self-pay | Admitting: Family Medicine

## 2021-05-25 DIAGNOSIS — Z01818 Encounter for other preprocedural examination: Secondary | ICD-10-CM | POA: Insufficient documentation

## 2021-05-25 HISTORY — PX: COSMETIC SURGERY: SHX468

## 2021-05-25 NOTE — Assessment & Plan Note (Signed)
Patient is cleared for surgery from a medical standpoint.  Today we ordered the following labs at the request of her surgeon, selected as "future": CBC, CMP, beta hCG quant, PT/INR, APTT, TSH with free T4, and CXR.  Patient wanted to contact her insurance prior to getting his labs drawn to see what the price may be.  They are ordered as future so she may return for what ever labs she chooses.  Preop clearance form completed and given to patient. ?

## 2021-05-29 ENCOUNTER — Other Ambulatory Visit: Payer: Medicaid Other

## 2021-05-29 ENCOUNTER — Ambulatory Visit
Admission: RE | Admit: 2021-05-29 | Discharge: 2021-05-29 | Disposition: A | Discharge status: Home / Self Care | Payer: Medicaid Other | Source: Ambulatory Visit | Attending: Family Medicine | Admitting: Family Medicine

## 2021-05-29 DIAGNOSIS — D649 Anemia, unspecified: Secondary | ICD-10-CM

## 2021-05-29 DIAGNOSIS — Z01818 Encounter for other preprocedural examination: Secondary | ICD-10-CM

## 2021-05-30 LAB — CBC
Hematocrit: 34.9 % (ref 34.0–46.6)
Hemoglobin: 11.3 g/dL (ref 11.1–15.9)
MCH: 26.1 pg — ABNORMAL LOW (ref 26.6–33.0)
MCHC: 32.4 g/dL (ref 31.5–35.7)
MCV: 81 fL (ref 79–97)
Platelets: 346 10*3/uL (ref 150–450)
RBC: 4.33 x10E6/uL (ref 3.77–5.28)
RDW: 15.3 % (ref 11.7–15.4)
WBC: 8 10*3/uL (ref 3.4–10.8)

## 2021-05-30 LAB — COMPREHENSIVE METABOLIC PANEL
ALT: 29 IU/L (ref 0–32)
AST: 24 IU/L (ref 0–40)
Albumin/Globulin Ratio: 1.9 (ref 1.2–2.2)
Albumin: 4.4 g/dL (ref 3.8–4.8)
Alkaline Phosphatase: 56 IU/L (ref 44–121)
BUN/Creatinine Ratio: 16 (ref 9–23)
BUN: 9 mg/dL (ref 6–20)
Bilirubin Total: 0.3 mg/dL (ref 0.0–1.2)
CO2: 21 mmol/L (ref 20–29)
Calcium: 9.3 mg/dL (ref 8.7–10.2)
Chloride: 102 mmol/L (ref 96–106)
Creatinine, Ser: 0.58 mg/dL (ref 0.57–1.00)
Globulin, Total: 2.3 g/dL (ref 1.5–4.5)
Glucose: 113 mg/dL — ABNORMAL HIGH (ref 70–99)
Potassium: 4.3 mmol/L (ref 3.5–5.2)
Sodium: 138 mmol/L (ref 134–144)
Total Protein: 6.7 g/dL (ref 6.0–8.5)
eGFR: 118 mL/min/{1.73_m2} (ref >59)

## 2021-05-30 LAB — THYROID PANEL WITH TSH
Free Thyroxine Index: 1.9 (ref 1.2–4.9)
T3 Uptake Ratio: 27 % (ref 24–39)
T4, Total: 6.9 ug/dL (ref 4.5–12.0)
TSH: 0.751 u[IU]/mL (ref 0.450–4.500)

## 2021-05-30 LAB — HEMOGLOBIN A1C
Est. average glucose Bld gHb Est-mCnc: 128 mg/dL
Hgb A1c MFr Bld: 6.1 % — ABNORMAL HIGH (ref 4.8–5.6)

## 2021-05-30 LAB — APTT: aPTT: 27 s (ref 24–33)

## 2021-05-30 LAB — BETA HCG QUANT (REF LAB): hCG Quant: <1 m[IU]/mL

## 2021-05-30 LAB — PROTIME-INR
INR: 1.1 (ref 0.9–1.2)
Prothrombin Time: 11.6 s (ref 9.1–12.0)

## 2021-05-30 LAB — HIV ANTIBODY (ROUTINE TESTING W REFLEX): HIV Screen 4th Generation wRfx: NONREACTIVE

## 2021-06-01 ENCOUNTER — Telehealth: Payer: Self-pay

## 2021-06-01 NOTE — Telephone Encounter (Signed)
Patient calls nurse line in regards to preop information.  ? ?Patient reports she gave PCP information at 4/26 visit. Patient reports she had to come back and sign a ROI. Patient reports she did this and gave front office surgeon info.  ? ?Will forward to PCP.  ?

## 2021-06-02 ENCOUNTER — Encounter: Payer: Self-pay | Admitting: Family Medicine

## 2021-06-03 ENCOUNTER — Ambulatory Visit: Payer: Medicaid Other | Admitting: Family Medicine

## 2021-06-03 ENCOUNTER — Encounter: Payer: Self-pay | Admitting: Family Medicine

## 2021-06-03 VITALS — BP 111/72 | HR 73 | Ht 62.0 in | Wt 171.8 lb

## 2021-06-03 DIAGNOSIS — Z01818 Encounter for other preprocedural examination: Secondary | ICD-10-CM | POA: Diagnosis not present

## 2021-06-03 NOTE — Patient Instructions (Signed)
Your EKG looks great with no concerns at this time.  I will have a picture that is also going to be uploaded into your visit notes that should be available for you to access as well. ? ?If you need anything further, please let our office know and we will assist you. ?

## 2021-06-03 NOTE — Progress Notes (Signed)
? ? ?  SUBJECTIVE:  ? ?CHIEF COMPLAINT / HPI:  ? ?Patient is needing an EKG completed for her preop clearance.  Her preop clearance appointment was done previously but was told by her surgeon that she would need to have an EKG completed.  Patient has no cardiac complaints at this time, denies any palpitations or chest pain ? ?PERTINENT  PMH / PSH: Reviewed ? ?OBJECTIVE:  ? ?BP 111/72   Pulse 73   Ht '5\' 2"'$  (1.575 m)   Wt 171 lb 12.8 oz (77.9 kg)   LMP 05/20/2021   SpO2 100%   BMI 31.42 kg/m?   ?Gen: well-appearing, NAD ?CV: RRR, no m/r/g appreciated, no peripheral edema ?Pulm: CTAB, no wheezes/crackles ? ? ? ? ? ?ASSESSMENT/PLAN:  ? ?Pre-op assessment EKG ?Patient having elective breast augmentation and tummy tuck.  Cardiac exam within normal limits.  EKG is normal sinus rhythm with normal axis deviation and good R wave progression.  No concerns with his EKG at this time. ?- Follow-up as needed ? ? ?Azarion Hove, DO ?[place]  ?

## 2021-06-11 ENCOUNTER — Encounter: Payer: Self-pay | Admitting: Family Medicine

## 2021-06-30 ENCOUNTER — Encounter: Payer: Self-pay | Admitting: *Deleted

## 2021-07-27 ENCOUNTER — Other Ambulatory Visit: Payer: Self-pay | Admitting: Family Medicine

## 2021-07-27 DIAGNOSIS — R7303 Prediabetes: Secondary | ICD-10-CM

## 2021-12-08 ENCOUNTER — Encounter: Payer: Self-pay | Admitting: Family Medicine

## 2021-12-08 ENCOUNTER — Ambulatory Visit: Payer: Commercial Managed Care - HMO | Admitting: Family Medicine

## 2021-12-08 VITALS — BP 118/74 | HR 75 | Temp 97.5°F | Ht 62.0 in | Wt 178.8 lb

## 2021-12-08 DIAGNOSIS — R7303 Prediabetes: Secondary | ICD-10-CM

## 2021-12-08 DIAGNOSIS — Z6831 Body mass index (BMI) 31.0-31.9, adult: Secondary | ICD-10-CM

## 2021-12-08 DIAGNOSIS — E669 Obesity, unspecified: Secondary | ICD-10-CM | POA: Diagnosis not present

## 2021-12-08 NOTE — Progress Notes (Signed)
Assessment/Plan:   Problem List Items Addressed This Visit       Other   Prediabetes - Primary   Other Visit Diagnoses     Class 1 obesity without serious comorbidity with body mass index (BMI) of 31.0 to 31.9 in adult, unspecified obesity type          Regarding patient's prediabetes, discussed risk and benefits of treatment.  Counseled on diet lifestyle modifications.  Given that she is on Ozempic, recommend that we can discontinue metformin and recheck hemoglobin A1c in 3 to 6 months.  Reviewed other labs and they are grossly reassuring  Obesity.  Briefly discussed weight management options and patient is content with treatment with Ozempic       Subjective:  HPI:  Catherine Houston is a 41 y.o. female who has BMI 31.0-31.9,adult; Prediabetes; Healthcare maintenance; Change in mole; Irregular menses; Chronic right shoulder pain; and Pre-op evaluation on their problem list..   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   She presents with chief complaint of Establish Care (Prediabetic. Nonfasting) .   PreDIABETES, established problem,  Medications: metformin and ozempic,  Diet-tries eat low-carb Interim History-  Last globin A1c in 05/2021 was 6.1.  Patient has been on metformin for several years.  Patient recently ran out.  Patient has been seeing an obesity/weight management clinic.  Recently started on Ozempic 0.5 mg q. weekly.  ROS: Denies Polyuria,Polydipsia, Denies  Hypoglycemia symptoms (palpitations, tremors,  anxiousness)   BMI 32.  Patient currently pursuing treatment for obesity at outside clinic with [facility].  Tolerating well.  Patient also had other restratification labs 05/2021 including CMP, PT/INR, EKG, CBC, thyroid that were all grossly unremarkable.  Patient has tried phentermine in the past without improvement in weight management.  Screening for breast cancer.  She has had breast augmentation surgery earlier this year.  She had questions regarding how often do screening for breast cancer status post breast implantation.  Patient did get breast implantation in [place].  She is not well established with that surgical clinic anymore.  Expressed that in the general population removed 40 recommend to get screening mammograms annually.  However breast augmentation can change cancer risk depending on the type of implant use.  However I am unfamiliar with the exact recommendation regarding this, and thus recommend patient follow-up with OB/GYN for further assistance.  Offered referral, however patient said that she would like to establish with OB/GYN on her own.  Catherine Houston Past Surgical History:  Procedure Laterality Date   COSMETIC SURGERY  05/2021   NO PAST SURGERIES      Outpatient Medications Prior to Visit  Medication Sig Dispense Refill   acetaminophen (TYLENOL) 325 MG tablet Take 650 mg by mouth every 6 (six) hours as needed.     Multiple Vitamin (MULTIVITAMIN WITH MINERALS) TABS tablet Take 1 tablet by mouth daily.     OZEMPIC, 0.25 OR 0.5 MG/DOSE, 2 MG/1.5ML SOPN INJECT 0.5 MG INTO THE SKIN ONCE WEEKLY 1.5 mL 11   metFORMIN (GLUCOPHAGE) 500 MG tablet TAKE 1 TABLET BY MOUTH TWICE DAILY WITH A MEAL 180 tablet 3   Blood Pressure Monitoring (BLOOD PRESSURE CUFF) MISC 1  Device by Does not apply route once a week. (Patient not taking: Reported on 07/03/2019) 1 each 0   naproxen (NAPROSYN) 500 MG tablet Take 1 tablet (500 mg total) by mouth 2 (two) times daily with a meal. (Patient not taking: Reported  on 12/08/2021) 60 tablet 1   No facility-administered medications prior to visit.    Family History  Problem Relation Age of Onset   Diabetes Mother    Hypertension Mother    Diabetes Father     Social History   Socioeconomic History   Marital status: Married    Spouse name: Not on file   Number of children: Not on file   Years of education: Not on file   Highest education level: Not on file  Occupational History   Not on file  Tobacco Use   Smoking status: Never    Passive exposure: Never   Smokeless tobacco: Never  Vaping Use   Vaping Use: Never used  Substance and Sexual Activity   Alcohol use: No   Drug use: No   Sexual activity: Yes    Birth control/protection: None  Other Topics Concern   Not on file  Social History Narrative   Marital status: married x 2017      Children: 2 children (19 yo, newborn)      Lives: with husband, 2 children      Employment: homemaker      Tobacco: none       Alcohol: none       Exercise: walking 30 minutes daily.   Social Determinants of Health   Financial Resource Strain: Not on file  Food Insecurity: Not on file  Transportation Needs: Not on file  Physical Activity: Not on file  Stress: Not on file  Social Connections: Not on file  Intimate Partner Violence: Not on file                                                                                                 Objective:  Physical Exam: BP 118/74 (BP Location: Left Arm, Patient Position: Sitting, Cuff Size: Large)   Pulse 75   Temp (!) 97.5 F (36.4 C) (Temporal)   Ht '5\' 2"'$  (1.575 m)   Wt 178 lb 12.8 oz (81.1 kg)   LMP 12/07/2021   SpO2 98%   BMI 32.70 kg/m    General: No acute distress. Awake and conversant.  Eyes: Normal conjunctiva, anicteric. Round symmetric pupils.  ENT: Hearing grossly intact. No nasal discharge.  Neck: Neck is supple. No masses or thyromegaly.  Respiratory: Respirations are non-labored. No auditory wheezing.  Skin: Warm. No  rashes or ulcers.  Psych: Alert and oriented. Cooperative, Appropriate mood and affect, Normal judgment.  CV: No cyanosis or JVD MSK: Normal ambulation. No clubbing  Neuro: Sensation and CN II-XII grossly normal.        Alesia Banda, MD, MS
